# Patient Record
Sex: Female | Born: 1945 | Race: Black or African American | Hispanic: No | Smoking: Former smoker
Health system: Southern US, Community
[De-identification: ages and names within clinical notes are randomized; demographics above are authoritative.]

## PROBLEM LIST (undated history)

## (undated) DIAGNOSIS — E039 Hypothyroidism, unspecified: Secondary | ICD-10-CM

## (undated) HISTORY — DX: Hypothyroidism, unspecified: E03.9

## (undated) HISTORY — PX: ABDOMINAL HYSTERECTOMY: SHX81

---

## 2018-06-23 ENCOUNTER — Encounter: Payer: Self-pay | Admitting: Family Medicine

## 2018-07-19 ENCOUNTER — Encounter: Payer: Self-pay | Admitting: Gastroenterology

## 2018-07-19 ENCOUNTER — Encounter

## 2018-07-19 ENCOUNTER — Ambulatory Visit (INDEPENDENT_AMBULATORY_CARE_PROVIDER_SITE_OTHER): Payer: Medicare Other | Admitting: Gastroenterology

## 2018-07-19 VITALS — BP 106/63 | HR 73 | Wt 192.0 lb

## 2018-07-19 DIAGNOSIS — K219 Gastro-esophageal reflux disease without esophagitis: Secondary | ICD-10-CM

## 2018-07-19 DIAGNOSIS — Z1211 Encounter for screening for malignant neoplasm of colon: Secondary | ICD-10-CM

## 2018-07-19 NOTE — Patient Instructions (Signed)
F/U 3 months Bed wedge  Gastroesophageal Reflux Disease, Adult Normally, food travels down the esophagus and stays in the stomach to be digested. If a person has gastroesophageal reflux disease (GERD), food and stomach acid move back up into the esophagus. When this happens, the esophagus becomes sore and swollen (inflamed). Over time, GERD can make small holes (ulcers) in the lining of the esophagus. Follow these instructions at home: Diet  Follow a diet as told by your doctor. You may need to avoid foods and drinks such as: ? Coffee and tea (with or without caffeine). ? Drinks that contain alcohol. ? Energy drinks and sports drinks. ? Carbonated drinks or sodas. ? Chocolate and cocoa. ? Peppermint and mint flavorings. ? Garlic and onions. ? Horseradish. ? Spicy and acidic foods, such as peppers, chili powder, curry powder, vinegar, hot sauces, and BBQ sauce. ? Citrus fruit juices and citrus fruits, such as oranges, lemons, and limes. ? Tomato-based foods, such as red sauce, chili, salsa, and pizza with red sauce. ? Fried and fatty foods, such as donuts, french fries, potato chips, and high-fat dressings. ? High-fat meats, such as hot dogs, rib eye steak, sausage, ham, and bacon. ? High-fat dairy items, such as whole milk, butter, and cream cheese.  Eat small meals often. Avoid eating large meals.  Avoid drinking large amounts of liquid with your meals.  Avoid eating meals during the 2-3 hours before bedtime.  Avoid lying down right after you eat.  Do not exercise right after you eat. General instructions  Pay attention to any changes in your symptoms.  Take over-the-counter and prescription medicines only as told by your doctor. Do not take aspirin, ibuprofen, or other NSAIDs unless your doctor says it is okay.  Do not use any tobacco products, including cigarettes, chewing tobacco, and e-cigarettes. If you need help quitting, ask your doctor.  Wear loose clothes. Do not  wear anything tight around your waist.  Raise (elevate) the head of your bed about 6 inches (15 cm).  Try to lower your stress. If you need help doing this, ask your doctor.  If you are overweight, lose an amount of weight that is healthy for you. Ask your doctor about a safe weight loss goal.  Keep all follow-up visits as told by your doctor. This is important. Contact a doctor if:  You have new symptoms.  You lose weight and you do not know why it is happening.  You have trouble swallowing, or it hurts to swallow.  You have wheezing or a cough that keeps happening.  Your symptoms do not get better with treatment.  You have a hoarse voice. Get help right away if:  You have pain in your arms, neck, jaw, teeth, or back.  You feel sweaty, dizzy, or light-headed.  You have chest pain or shortness of breath.  You throw up (vomit) and your throw up looks like blood or coffee grounds.  You pass out (faint).  Your poop (stool) is bloody or black.  You cannot swallow, drink, or eat. This information is not intended to replace advice given to you by your health care provider. Make sure you discuss any questions you have with your health care provider. Document Released: 06/01/2008 Document Revised: 05/21/2016 Document Reviewed: 04/10/2015 Elsevier Interactive Patient Education  [employer].

## 2018-07-20 ENCOUNTER — Telehealth: Payer: Self-pay | Admitting: Gastroenterology

## 2018-07-20 MED ORDER — BISACODYL 5 MG PO TBEC: 10.0000 mg | DELAYED_RELEASE_TABLET | Freq: Once | ORAL | 0 refills | Status: AC

## 2018-07-20 MED ORDER — PEG 3350-KCL-NA BICARB-NACL 420 G PO SOLR: 4000.0000 mL | Freq: Once | ORAL | 0 refills | Status: AC

## 2018-07-20 NOTE — Telephone Encounter (Signed)
PT  Left vm she states her rx is  Dexalant 60 mg tablets and the other one rx Ramaton 300 mg  My number is [phone]

## 2018-07-21 NOTE — Progress Notes (Signed)
Betty Reynolds [address]  [place]  [place], [place] [address]  Main: [phone]  Fax: [phone]   Gastroenterology Consultation  Referring Provider:     Remi Haggard, FNP Primary Care Physician:  Remi Haggard, FNP Primary Gastroenterologist:  Dr. Vonda Reynolds Reason for Consultation:     Heartburn        HPI:    Chief Complaint  Patient presents with  . Gastroesophageal Reflux    and discuss colonoscopy    Betty Reynolds is a 72 y.o. y/o female referred for consultation & management  by Dr. Lavena Bullion, Jordan Likes, FNP.  Patient reported a 66-month history of daily, severe heartburn, which manifests as burning sensation in her chest.  No weight loss or abdominal pain.  Was prescribed Dexilant by her primary care provider, which was $120 for her, and she states despite it being that expensive, she went ahead and filled it because she was in a lot of pain from the heartburn.  Medication has helped but she is still having daily symptoms.  She is also on ranitidine at bedtime.  Past Medical History:  Diagnosis Date  . Hypothyroid     Past Surgical History:  Procedure Laterality Date  . ABDOMINAL HYSTERECTOMY      Prior to Admission medications   Medication Sig Start Date End Date Taking? Authorizing Provider  diphenhydrAMINE (BENADRYL) 25 MG tablet Take 25 mg by mouth every 6 (six) hours as needed.   Yes [provider]  levothyroxine (SYNTHROID, LEVOTHROID) 150 MCG tablet  08/16/17  Yes [provider]  Multiple Vitamin (MULTIVITAMIN) tablet Take 1 tablet by mouth daily.   Yes [provider]  potassium chloride (K-DUR) 10 MEQ tablet Take by mouth daily.   Yes [provider]    No family history on file.   Social History   Tobacco Use  . Smoking status: Former Smoker    Types: Cigarettes    Last attempt to quit: 12/30/2015    Years since quitting: 2.5  . Smokeless tobacco: Never Used  Substance Use  Topics  . Alcohol use: Never    Frequency: Never  . Drug use: Not on file    Allergies as of 07/19/2018  . (Not on File)    Review of Systems:    All systems reviewed and negative except where noted in HPI.   Physical Exam:  BP 106/63   Pulse 73   Wt 192 lb (87.1 kg)  No LMP recorded. Psych:  Alert and cooperative. Normal mood and affect. General:   Alert,  Well-developed, well-nourished, pleasant and cooperative in NAD Head:  Normocephalic and atraumatic. Eyes:  Sclera clear, no icterus.   Conjunctiva pink. Ears:  Normal auditory acuity. Nose:  No deformity, discharge, or lesions. Mouth:  No deformity or lesions,oropharynx pink & moist. Neck:  Supple; no masses or thyromegaly. Lungs:  Respirations even and unlabored.  Clear throughout to auscultation.   No wheezes, crackles, or rhonchi. No acute distress. Heart:  Regular rate and rhythm; no murmurs, clicks, rubs, or gallops. Abdomen:  Normal bowel sounds.  No bruits.  Soft, non-tender and non-distended without masses, hepatosplenomegaly or hernias noted.  No guarding or rebound tenderness.    Msk:  Symmetrical without gross deformities. Good, equal movement & strength bilaterally. Pulses:  Normal pulses noted. Extremities:  No clubbing or edema.  No cyanosis. Neurologic:  Alert and oriented x3;  grossly normal neurologically. Skin:  Intact without significant lesions or rashes.  No jaundice. Lymph Nodes:  No significant cervical adenopathy. Psych:  Alert and cooperative. Normal mood and affect.   Labs: Scanned labs reviewed, and show normal hemoglobin, MCV, and liver enzymes Imaging Studies: No results found.  Assessment and Plan:   Betty Reynolds is a 72 y.o. y/o female has been referred for severe heartburn despite PPI  Dexilant is too expensive for her, but she already has a 30-day supply We have offered to either switch her medication at omeprazole, or let her take the [place] and when she is running out, then  fill omeprazole.  She states she will think about it and let us know Patient educated extensively on acid reflux lifestyle modification, including buying a bed wedge, not eating 3 hrs before bedtime, diet modifications, and handout given for the same.   Due to uncontrolled symptoms, will proceed with EGD  She is also due for colorectal cancer screening and is agreeable to having it done along with her EGD  I have discussed alternative options, risks & benefits,  which include, but are not limited to, bleeding, infection, perforation,respiratory complication & drug reaction.  The patient agrees with this plan & written consent will be obtained.      Dr Betty Reynolds

## 2018-07-21 NOTE — Telephone Encounter (Signed)
LMTCO.

## 2018-08-01 NOTE — Telephone Encounter (Signed)
Pt called regarding prep instructions for colonoscopy. Questions answered. Also I asked her about the dexilant and zantac. She has not gotten them yet. Was given message from Dr. Bonna Gains regarding possibly taking the omeprazole. Pt decided she would wait until after her procedures.

## 2018-08-03 ENCOUNTER — Ambulatory Visit: Payer: Medicare Other | Admitting: Registered Nurse

## 2018-08-03 ENCOUNTER — Encounter: Payer: Self-pay | Admitting: *Deleted

## 2018-08-03 ENCOUNTER — Ambulatory Visit
Admission: RE | Admit: 2018-08-03 | Discharge: 2018-08-03 | Disposition: A | Discharge status: Home / Self Care | Payer: Medicare Other | Source: Ambulatory Visit | Attending: Gastroenterology | Admitting: Gastroenterology

## 2018-08-03 ENCOUNTER — Encounter
Admission: RE | Disposition: A | Discharge status: Home / Self Care | Payer: Self-pay | Source: Ambulatory Visit | Attending: Gastroenterology

## 2018-08-03 DIAGNOSIS — K2289 Other specified disease of esophagus: Secondary | ICD-10-CM

## 2018-08-03 DIAGNOSIS — K219 Gastro-esophageal reflux disease without esophagitis: Secondary | ICD-10-CM

## 2018-08-03 DIAGNOSIS — K5730 Diverticulosis of large intestine without perforation or abscess without bleeding: Secondary | ICD-10-CM | POA: Diagnosis not present

## 2018-08-03 DIAGNOSIS — R12 Heartburn: Secondary | ICD-10-CM

## 2018-08-03 DIAGNOSIS — K228 Other specified diseases of esophagus: Secondary | ICD-10-CM | POA: Diagnosis not present

## 2018-08-03 DIAGNOSIS — K529 Noninfective gastroenteritis and colitis, unspecified: Secondary | ICD-10-CM | POA: Insufficient documentation

## 2018-08-03 DIAGNOSIS — K319 Disease of stomach and duodenum, unspecified: Secondary | ICD-10-CM | POA: Diagnosis not present

## 2018-08-03 DIAGNOSIS — K2100 Gastro-esophageal reflux disease with esophagitis, without bleeding: Secondary | ICD-10-CM

## 2018-08-03 DIAGNOSIS — K2950 Unspecified chronic gastritis without bleeding: Secondary | ICD-10-CM | POA: Diagnosis not present

## 2018-08-03 DIAGNOSIS — D125 Benign neoplasm of sigmoid colon: Secondary | ICD-10-CM | POA: Insufficient documentation

## 2018-08-03 DIAGNOSIS — Z79899 Other long term (current) drug therapy: Secondary | ICD-10-CM | POA: Insufficient documentation

## 2018-08-03 DIAGNOSIS — K635 Polyp of colon: Secondary | ICD-10-CM

## 2018-08-03 DIAGNOSIS — E039 Hypothyroidism, unspecified: Secondary | ICD-10-CM | POA: Diagnosis not present

## 2018-08-03 DIAGNOSIS — K3189 Other diseases of stomach and duodenum: Secondary | ICD-10-CM

## 2018-08-03 DIAGNOSIS — Z1211 Encounter for screening for malignant neoplasm of colon: Secondary | ICD-10-CM | POA: Diagnosis present

## 2018-08-03 DIAGNOSIS — Z87891 Personal history of nicotine dependence: Secondary | ICD-10-CM | POA: Insufficient documentation

## 2018-08-03 HISTORY — PX: ESOPHAGOGASTRODUODENOSCOPY (EGD) WITH PROPOFOL: SHX5813

## 2018-08-03 HISTORY — PX: COLONOSCOPY WITH PROPOFOL: SHX5780

## 2018-08-03 SURGERY — COLONOSCOPY WITH PROPOFOL
Anesthesia: General

## 2018-08-03 MED ORDER — SODIUM CHLORIDE 0.9 % IJ SOLN
INTRAMUSCULAR | Status: DC | PRN
  Administered 2018-08-03: 3 mL

## 2018-08-03 MED ORDER — OMEPRAZOLE 20 MG PO CPDR: 20.0000 mg | DELAYED_RELEASE_CAPSULE | Freq: Two times a day (BID) | ORAL | 1 refills | Status: DC

## 2018-08-03 MED ORDER — LIDOCAINE HCL (CARDIAC) PF 100 MG/5ML IV SOSY
PREFILLED_SYRINGE | INTRAVENOUS | Status: DC | PRN
  Administered 2018-08-03: 60 mg via INTRAVENOUS

## 2018-08-03 MED ORDER — PROPOFOL 500 MG/50ML IV EMUL
INTRAVENOUS | Status: DC | PRN
  Administered 2018-08-03: 140 ug/kg/min via INTRAVENOUS

## 2018-08-03 MED ORDER — PROPOFOL 500 MG/50ML IV EMUL
INTRAVENOUS | Status: AC
  Filled 2018-08-03: qty 100

## 2018-08-03 MED ORDER — GLYCOPYRROLATE 0.2 MG/ML IJ SOLN
INTRAMUSCULAR | Status: DC | PRN
  Administered 2018-08-03: 0.2 mg via INTRAVENOUS

## 2018-08-03 MED ORDER — PROPOFOL 10 MG/ML IV BOLUS
INTRAVENOUS | Status: DC | PRN
  Administered 2018-08-03: 60 mg via INTRAVENOUS

## 2018-08-03 MED ORDER — SODIUM CHLORIDE 0.9 % IV SOLN
INTRAVENOUS | Status: DC
  Administered 2018-08-03: 1000 mL via INTRAVENOUS

## 2018-08-03 NOTE — Op Note (Signed)
[facility] Gastroenterology Patient Name: Betty Reynolds Procedure Date: 08/03/2018 9:10 AM MRN: [id number] Account #: [id number] Date of Birth: [birth year] Admit Type: Outpatient Age: 72 Room: [facility] ENDO ROOM 2 Gender: Female Note Status: Finalized Procedure:            Colonoscopy Indications:          Screening for colorectal malignant neoplasm Providers:            Harel Repetto B. Bonna Gains MD, MD Referring MD:         Jordan Likes. Lavena Bullion (Referring MD) Medicines:            Monitored Anesthesia Care Complications:        No immediate complications. Procedure:            Pre-Anesthesia Assessment:                       - ASA Grade Assessment: II - A patient with mild                        systemic disease.                       - Prior to the procedure, a History and Physical was                        performed, and patient medications, allergies and                        sensitivities were reviewed. The patient's tolerance of                        previous anesthesia was reviewed.                       - The risks and benefits of the procedure and the                        sedation options and risks were discussed with the                        patient. All questions were answered and informed                        consent was obtained.                       - Patient identification and proposed procedure were                        verified prior to the procedure by the physician, the                        nurse, the anesthesiologist, the anesthetist and the                        technician. The procedure was verified in the procedure                        room.  After obtaining informed consent, the colonoscope was                        passed under direct vision. Throughout the procedure,                        the patient's blood pressure, pulse, and oxygen                        saturations were monitored continuously. The                 Colonoscope was introduced through the anus and                        advanced to the the cecum, identified by appendiceal                        orifice and ileocecal valve. The colonoscopy was                        performed with ease. The patient tolerated the                        procedure well. The quality of the bowel preparation                        was good. Findings:      The perianal and digital rectal examinations were normal.      A 6 mm polyp was found in the sigmoid colon. The polyp was sessile. Area       was successfully injected with saline for lesion assessment, and this       injection appeared to lift the lesion adequately. It was injected with       saline to ensure it was a polyp and not an inverted diverticulum.       Appearance after lift consistent with a polyp. The polyp was removed       with a cold snare. Resection and retrieval were complete.      A 7 mm polyp was found in the sigmoid colon. The polyp was       semi-pedunculated. The polyp was removed with a hot snare. Resection and       retrieval were complete.      Multiple diverticula were found in the sigmoid colon.      The exam was otherwise without abnormality.      The rectum, sigmoid colon, descending colon, transverse colon, ascending       colon and cecum appeared normal.      The retroflexed view of the distal rectum and anal verge was normal and       showed no anal or rectal abnormalities. Impression:           - One 6 mm polyp in the sigmoid colon, removed with a                        cold snare. Resected and retrieved. Injected.                       - One 7 mm polyp in the sigmoid colon, removed with a  hot snare. Resected and retrieved.                       - Diverticulosis in the sigmoid colon.                       - The examination was otherwise normal.                       - The rectum, sigmoid colon, descending colon,                         transverse colon, ascending colon and cecum are normal.                       - The distal rectum and anal verge are normal on                        retroflexion view. Recommendation:       - Discharge patient to home (with escort).                       - Advance diet as tolerated.                       - Continue present medications.                       - Await pathology results.                       - Repeat colonoscopy in 5 years for surveillance.                       - The findings and recommendations were discussed with                        the patient.                       - The findings and recommendations were discussed with                        the patient's family.                       - Return to primary care physician as previously                        scheduled.                       - High fiber diet. Procedure Code(s):    --- Professional ---                       [phone], Colonoscopy, flexible; with removal of tumor(s),                        polyp(s), or other lesion(s) by snare technique                       [id number], Colonoscopy, flexible; with directed submucosal  injection(s), any substance Diagnosis Code(s):    --- Professional ---                       Z12.11, Encounter for screening for malignant neoplasm                        of colon                       D12.5, Benign neoplasm of sigmoid colon                       K57.30, Diverticulosis of large intestine without                        perforation or abscess without bleeding CPT copyright 2017 American Medical Association. All rights reserved. The codes documented in this report are preliminary and upon coder review may  be revised to meet current compliance requirements.  Vonda Antigua, MD Margretta Sidle B. Bonna Gains MD, MD 08/03/2018 10:14:23 AM This report has been signed electronically. Number of Addenda: 0 Note Initiated On: 08/03/2018 9:10 AM Scope Withdrawal Time: 0 hours  30 minutes 10 seconds  Total Procedure Duration: 0 hours 38 minutes 15 seconds  Estimated Blood Loss: Estimated blood loss: none.      [facility]

## 2018-08-03 NOTE — Transfer of Care (Signed)
Immediate Anesthesia Transfer of Care Note  Patient: Betty Reynolds  Procedure(s) Performed: COLONOSCOPY WITH PROPOFOL (N/A ) ESOPHAGOGASTRODUODENOSCOPY (EGD) WITH PROPOFOL (N/A )  Patient Location: PACU  Anesthesia Type:General  Level of Consciousness: sedated  Airway & Oxygen Therapy: Patient Spontanous Breathing and Patient connected to nasal cannula oxygen  Post-op Assessment: Report given to RN and Post -op Vital signs reviewed and stable  Post vital signs: Reviewed and stable  Last Vitals:  Vitals Value Taken Time  BP 120/79 08/03/2018 10:15 AM  Temp 36.1 C 08/03/2018 10:15 AM  Pulse 87 08/03/2018 10:15 AM  Resp 18 08/03/2018 10:15 AM  SpO2 97 % 08/03/2018 10:15 AM  Vitals shown include unvalidated device data.  Last Pain:  Vitals:   08/03/18 1010  TempSrc: Tympanic  PainSc:          Complications: No apparent anesthesia complications

## 2018-08-03 NOTE — H&P (Signed)
Vonda Antigua, MD [address] [place], [place], [place], [address] [address], [place], [place], [place], [address] Phone: [phone]  Fax: [phone]  Primary Care Physician:  Remi Haggard, FNP   Pre-Procedure History & Physical: HPI:  Betty Reynolds is a 72 y.o. female is here for a colonoscopy and EGD.   Past Medical History:  Diagnosis Date  . Hypothyroid     Past Surgical History:  Procedure Laterality Date  . ABDOMINAL HYSTERECTOMY      Prior to Admission medications   Medication Sig Start Date End Date Taking? Authorizing Provider  diphenhydrAMINE (BENADRYL) 25 MG tablet Take 25 mg by mouth every 6 (six) hours as needed.    [provider]  levothyroxine (SYNTHROID, LEVOTHROID) 150 MCG tablet  08/16/17   [provider]  Multiple Vitamin (MULTIVITAMIN) tablet Take 1 tablet by mouth daily.    [provider]  potassium chloride (K-DUR) 10 MEQ tablet Take by mouth daily.    [provider]    Allergies as of 07/21/2018  . (No Known Allergies)    History reviewed. No pertinent family history.  Social History   Socioeconomic History  . Marital status: Widowed    Spouse name: Not on file  . Number of children: Not on file  . Years of education: Not on file  . Highest education level: Not on file  Occupational History  . Not on file  Social Needs  . Financial resource strain: Not on file  . Food insecurity:    Worry: Not on file    Inability: Not on file  . Transportation needs:    Medical: Not on file    Non-medical: Not on file  Tobacco Use  . Smoking status: Former Smoker    Types: Cigarettes    Last attempt to quit: 12/30/2015    Years since quitting: 2.5  . Smokeless tobacco: Never Used  Substance and Sexual Activity  . Alcohol use: Never    Frequency: Never  . Drug use: Not on file  . Sexual activity: Not on file  Lifestyle  . Physical activity:    Days per week: Not on file    Minutes  per session: Not on file  . Stress: Not on file  Relationships  . Social connections:    Talks on phone: Not on file    Gets together: Not on file    Attends religious service: Not on file    Active member of club or organization: Not on file    Attends meetings of clubs or organizations: Not on file    Relationship status: Not on file  . Intimate partner violence:    Fear of current or ex partner: Not on file    Emotionally abused: Not on file    Physically abused: Not on file    Forced sexual activity: Not on file  Other Topics Concern  . Not on file  Social History Narrative  . Not on file    Review of Systems: See HPI, otherwise negative ROS  Physical Exam: BP 119/79   Pulse 76   Temp (!) 97.1 F (36.2 C) (Tympanic)   Resp 16   Ht 5\' 9"  (1.753 m)   Wt 190 lb (86.2 kg)   SpO2 100%   BMI 28.06 kg/m  General:   Alert,  pleasant and cooperative in NAD Head:  Normocephalic and atraumatic. Neck:  Supple; no masses or thyromegaly. Lungs:  Clear throughout to auscultation, normal respiratory  effort.    Heart:  +S1, +S2, Regular rate and rhythm, No edema. Abdomen:  Soft, nontender and nondistended. Normal bowel sounds, without guarding, and without rebound.   Neurologic:  Alert and  oriented x4;  grossly normal neurologically.  Impression/Plan: Betty Reynolds is here for a colonoscopy to be performed for average risk screening and EGD for Acid Reflux.  Risks, benefits, limitations, and alternatives regarding the procedures have been reviewed with the patient.  Questions have been answered.  All parties agreeable.   Virgel Manifold, MD  08/03/2018, 8:58 AM

## 2018-08-03 NOTE — Anesthesia Preprocedure Evaluation (Signed)
Anesthesia Evaluation  Patient identified by MRN, date of birth, ID band Patient awake    Reviewed: Allergy & Precautions, H&P , NPO status , reviewed documented beta blocker date and time   Airway Mallampati: II  TM Distance: >3 FB Neck ROM: full    Dental  (+) Upper Dentures, Partial Lower   Pulmonary former smoker,    Pulmonary exam normal        Cardiovascular Normal cardiovascular exam     Neuro/Psych    GI/Hepatic   Endo/Other  Hypothyroidism   Renal/GU      Musculoskeletal   Abdominal   Peds  Hematology   Anesthesia Other Findings Past Medical History: No date: Hypothyroid  Past Surgical History: No date: ABDOMINAL HYSTERECTOMY  BMI    Body Mass Index:  28.06 kg/m      Reproductive/Obstetrics                             Anesthesia Physical Anesthesia Plan  ASA: II  Anesthesia Plan: General   Post-op Pain Management:    Induction: Intravenous  PONV Risk Score and Plan: 2 and Treatment may vary due to age or medical condition and TIVA  Airway Management Planned: Nasal Cannula and Natural Airway  Additional Equipment:   Intra-op Plan:   Post-operative Plan:   Informed Consent: I have reviewed the patients History and Physical, chart, labs and discussed the procedure including the risks, benefits and alternatives for the proposed anesthesia with the patient or authorized representative who has indicated his/her understanding and acceptance.   Dental Advisory Given  Plan Discussed with: CRNA  Anesthesia Plan Comments:         Anesthesia Quick Evaluation

## 2018-08-03 NOTE — Anesthesia Post-op Follow-up Note (Signed)
Anesthesia QCDR form completed.        

## 2018-08-03 NOTE — Op Note (Signed)
[facility] Gastroenterology Patient Name: Betty Reynolds Procedure Date: 08/03/2018 9:12 AM MRN: [id number] Account #: [id number] Date of Birth: 1946/01/21 Admit Type: Outpatient Age: 72 Room: [facility] ENDO ROOM 2 Gender: Female Note Status: Finalized Procedure:            Upper GI endoscopy Indications:          Heartburn Providers:            Caisley Baxendale B. Bonna Gains MD, MD Referring MD:         Jordan Likes. Lavena Bullion (Referring MD) Medicines:            Monitored Anesthesia Care Complications:        No immediate complications. Procedure:            Pre-Anesthesia Assessment:                       - Prior to the procedure, a History and Physical was                        performed, and patient medications, allergies and                        sensitivities were reviewed. The patient's tolerance of                        previous anesthesia was reviewed.                       - The risks and benefits of the procedure and the                        sedation options and risks were discussed with the                        patient. All questions were answered and informed                        consent was obtained.                       - Patient identification and proposed procedure were                        verified prior to the procedure by the physician, the                        nurse, the anesthesiologist, the anesthetist and the                        technician. The procedure was verified in the procedure                        room.                       - ASA Grade Assessment: II - A patient with mild                        systemic disease.                       After obtaining  informed consent, the endoscope was                        passed under direct vision. Throughout the procedure,                        the patient's blood pressure, pulse, and oxygen                        saturations were monitored continuously. The Endoscope                        was  introduced through the mouth, and advanced to the                        second part of duodenum. The upper GI endoscopy was                        accomplished with ease. The patient tolerated the                        procedure well. Findings:      LA Grade B (one or more mucosal breaks greater than 5 mm, not extending       between the tops of two mucosal folds) esophagitis with no bleeding was       found in the distal esophagus.      One tongue of salmon-colored mucosa was present from 38 to 39 cm. No       other visible abnormalities were present. Biopsies were not done due to       the presence of esophagitis in the area, and biopsies in the setting of       esophagitis can overdiagnose/over-call dysplasia. Repeat EGD is       recommended instead to allow for healing of esophagitis and biopsies can       be done at that time.      A single localized, 3 mm non-bleeding erosion was found in the gastric       antrum. There were no stigmata of recent bleeding. Biopsies were taken       with a cold forceps for histology.      Patchy mildly erythematous mucosa without bleeding was found in the       gastric antrum. Biopsies were taken with a cold forceps for histology.       Biopsies were obtained in the gastric body, at the incisura and in the       gastric antrum with cold forceps for histology.      The duodenal bulb, second portion of the duodenum and examined duodenum       were normal. Impression:           - LA Grade B reflux esophagitis.                       - Salmon-colored mucosa.                       - Non-bleeding erosive gastropathy. Biopsied.                       - Erythematous mucosa in the antrum. Biopsied.                       -  Normal duodenal bulb, second portion of the duodenum                        and examined duodenum.                       - Biopsies were obtained in the gastric body, at the                        incisura and in the gastric  antrum. Recommendation:       - Await pathology results.                       - Discharge patient to home (with escort).                       - Advance diet as tolerated.                       - Continue present medications.                       - Patient has a contact number available for                        emergencies. The signs and symptoms of potential                        delayed complications were discussed with the patient.                        Return to normal activities tomorrow. Written discharge                        instructions were provided to the patient.                       - Discharge patient to home (with escort).                       - The findings and recommendations were discussed with                        the patient.                       - The findings and recommendations were discussed with                        the patient's family.                       - Follow an antireflux regimen.                       - Take prescribed proton pump inhibitor or H2 blocker                        (antacid) medications 30 - 60 minutes before meals (Pt                        was not taking any PPI since her last clinic  visit at                        her own discretion).                       - Repeat upper endoscopy 3-6 months. Procedure Code(s):    --- Professional ---                       [phone], Esophagogastroduodenoscopy, flexible, transoral;                        with biopsy, single or multiple Diagnosis Code(s):    --- Professional ---                       K21.0, Gastro-esophageal reflux disease with esophagitis                       K22.8, Other specified diseases of esophagus                       K31.89, Other diseases of stomach and duodenum                       R12, Heartburn CPT copyright 2017 American Medical Association. All rights reserved. The codes documented in this report are preliminary and upon coder review may  be revised to meet  current compliance requirements.  Vonda Antigua, MD Margretta Sidle B. Bonna Gains MD, MD 08/03/2018 9:30:07 AM This report has been signed electronically. Number of Addenda: 0 Note Initiated On: 08/03/2018 9:12 AM Estimated Blood Loss: Estimated blood loss: none.      [facility]

## 2018-08-03 NOTE — Anesthesia Postprocedure Evaluation (Signed)
Anesthesia Post Note  Patient: [name]  Procedure(s) Performed: COLONOSCOPY WITH PROPOFOL (N/A ) ESOPHAGOGASTRODUODENOSCOPY (EGD) WITH PROPOFOL (N/A )  Patient location during evaluation: Endoscopy Anesthesia Type: General Level of consciousness: awake and alert Pain management: pain level controlled Vital Signs Assessment: post-procedure vital signs reviewed and stable Respiratory status: spontaneous breathing, nonlabored ventilation and respiratory function stable Cardiovascular status: blood pressure returned to baseline and stable Postop Assessment: no apparent nausea or vomiting Anesthetic complications: no     Last Vitals:  Vitals:   08/03/18 1020 08/03/18 1030  BP: (!) 98/45 (!) 121/95  Pulse: 87 90  Resp: 17 20  Temp:    SpO2: 98% 100%    Last Pain:  Vitals:   08/03/18 1010  TempSrc: Tympanic  PainSc:                  Alphonsus Sias

## 2018-08-04 LAB — SURGICAL PATHOLOGY

## 2018-08-08 ENCOUNTER — Other Ambulatory Visit: Payer: Self-pay | Admitting: Family Medicine

## 2018-08-08 ENCOUNTER — Ambulatory Visit
Admission: RE | Admit: 2018-08-08 | Discharge: 2018-08-08 | Disposition: A | Discharge status: Home / Self Care | Payer: Medicare Other | Source: Ambulatory Visit | Attending: Family Medicine | Admitting: Family Medicine

## 2018-08-08 DIAGNOSIS — R52 Pain, unspecified: Secondary | ICD-10-CM

## 2018-08-08 DIAGNOSIS — M50322 Other cervical disc degeneration at C5-C6 level: Secondary | ICD-10-CM | POA: Insufficient documentation

## 2018-08-08 DIAGNOSIS — M5136 Other intervertebral disc degeneration, lumbar region: Secondary | ICD-10-CM | POA: Insufficient documentation

## 2018-08-08 DIAGNOSIS — M542 Cervicalgia: Secondary | ICD-10-CM | POA: Diagnosis present

## 2018-08-08 DIAGNOSIS — M545 Low back pain: Secondary | ICD-10-CM | POA: Diagnosis present

## 2018-10-31 ENCOUNTER — Ambulatory Visit: Payer: Medicare Other | Admitting: Gastroenterology

## 2018-11-07 ENCOUNTER — Encounter: Payer: Self-pay | Admitting: Gastroenterology

## 2018-11-07 ENCOUNTER — Ambulatory Visit (INDEPENDENT_AMBULATORY_CARE_PROVIDER_SITE_OTHER): Payer: Medicare Other | Admitting: Gastroenterology

## 2018-11-07 VITALS — BP 126/70 | HR 79 | Wt 195.4 lb

## 2018-11-07 DIAGNOSIS — K208 Other esophagitis without bleeding: Secondary | ICD-10-CM

## 2018-11-07 MED ORDER — OMEPRAZOLE 20 MG PO CPDR: 20.0000 mg | DELAYED_RELEASE_CAPSULE | Freq: Every day | ORAL | 0 refills | Status: DC

## 2018-11-07 NOTE — Progress Notes (Signed)
Vonda Antigua, MD [address]  [address]  [place], [place] [address]  Main: [phone]  Fax: [phone]   Primary Care Physician: Remi Haggard, FNP  Primary Gastroenterologist:  Dr. Vonda Antigua  Chief complaint: Follow-up for esophagitis  HPI: Betty Reynolds is a 72 y.o. female seen in July 2019 due to severe heartburn while taking Dexilant.  Patient underwent EGD in August 2019 showing grade B esophagitis.  One tongue of salmon-colored mucosa was seen but was not biopsied due to presence of esophagitis.  Single gastric antrum erosion, and gastric erythema was seen.  Biopsies were negative for H. Pylori.  Patient was given omeprazole after the EGD and states she completed 3 weeks ago.  Reports 1-2 episodes a week of heartburn since then and has been taking Tums.  Screening colonoscopy at the same time showed 2 polyps, 6 mm and 7 mm in the sigmoid colon.  Diverticulosis reported.  One polyp showed tubular adenoma.  Another showed fragments of colitis with minimal architectural changes.  Patient denies any diarrhea, abdominal pain.  Current Outpatient Medications  Medication Sig Dispense Refill  . diphenhydrAMINE (BENADRYL) 25 MG tablet Take 25 mg by mouth every 6 (six) hours as needed.    Marland Kitchen levothyroxine (SYNTHROID, LEVOTHROID) 150 MCG tablet     . Multiple Vitamin (MULTIVITAMIN) tablet Take 1 tablet by mouth daily.    Marland Kitchen omeprazole (PRILOSEC) 20 MG capsule Take 1 capsule (20 mg total) by mouth 2 (two) times daily before a meal. 60 capsule 1  . potassium chloride (K-DUR) 10 MEQ tablet Take by mouth daily.     No current facility-administered medications for this visit.     Allergies as of 11/07/2018  . (No Known Allergies)    ROS:  General: Negative for anorexia, weight loss, fever, chills, fatigue, weakness. ENT: Negative for hoarseness, difficulty swallowing , nasal congestion. CV: Negative for chest pain, angina, palpitations, dyspnea on exertion,  peripheral edema.  Respiratory: Negative for dyspnea at rest, dyspnea on exertion, cough, sputum, wheezing.  GI: See history of present illness. GU:  Negative for dysuria, hematuria, urinary incontinence, urinary frequency, nocturnal urination.  Endo: Negative for unusual weight change.    Physical Examination: Vitals:   11/07/18 1049  BP: 126/70  Pulse: 79  Weight: 195 lb 6.4 oz (88.6 kg)     General: Well-nourished, well-developed in no acute distress.  Eyes: No icterus. Conjunctivae pink. Mouth: Oropharyngeal mucosa moist and pink , no lesions erythema or exudate. Neck: Supple, Trachea midline Abdomen: Bowel sounds are normal, nontender, nondistended, no hepatosplenomegaly or masses, no abdominal bruits or hernia , no rebound or guarding.   Extremities: No lower extremity edema. No clubbing or deformities. Neuro: Alert and oriented x 3.  Grossly intact. Skin: Warm and dry, no jaundice.   Psych: Alert and cooperative, normal mood and affect.   Labs: CMP  No results found for: NA, K, CL, CO2, GLUCOSE, BUN, CREATININE, CALCIUM, PROT, ALBUMIN, AST, ALT, ALKPHOS, BILITOT, GFRNONAA, GFRAA No results found for: WBC, HGB, HCT, MCV, PLT  Imaging Studies: No results found.  Assessment and Plan:   Betty Reynolds is a 72 y.o. y/o female here for follow-up of esophagitis and GERD  We discussed need for repeat EGD to reevaluate if esophagitis is resolved and obtain biopsies for Barrett's esophagus Since patient has been having heartburn 1-2 times a week as she stopped taking her omeprazole, she would like to resume omeprazole prior to the EGD. (Risks of PPI use  were discussed with patient including bone loss, C. Diff diarrhea, pneumonia, infections, CKD, electrolyte abnormalities.  If clinically possible based on symptoms, goal would be to maintain patient on the lowest dose possible, or discontinue the medication with institution of acid reflux lifestyle modifications over time. Pt.  Verbalizes understanding and chooses to continue the medication.)  We will refill omeprazole for once daily Patient educated extensively on acid reflux lifestyle modification, including buying a bed wedge, not eating 3 hrs before bedtime, diet modifications, and handout given for the same.   Repeat EGD in 2 to 4 weeks after restarting omeprazole  Colonoscopy up-to-date.  Surveillance due 5 years from August 2019    Dr Vonda Antigua

## 2018-11-07 NOTE — Patient Instructions (Signed)
Bed wedge  EGD scheduled for 11/30/2018  Gastroesophageal Reflux Disease, Adult Normally, food travels down the esophagus and stays in the stomach to be digested. If a person has gastroesophageal reflux disease (GERD), food and stomach acid move back up into the esophagus. When this happens, the esophagus becomes sore and swollen (inflamed). Over time, GERD can make small holes (ulcers) in the lining of the esophagus. Follow these instructions at home: Diet  Follow a diet as told by your doctor. You may need to avoid foods and drinks such as: ? Coffee and tea (with or without caffeine). ? Drinks that contain alcohol. ? Energy drinks and sports drinks. ? Carbonated drinks or sodas. ? Chocolate and cocoa. ? Peppermint and mint flavorings. ? Garlic and onions. ? Horseradish. ? Spicy and acidic foods, such as peppers, chili powder, curry powder, vinegar, hot sauces, and BBQ sauce. ? Citrus fruit juices and citrus fruits, such as oranges, lemons, and limes. ? Tomato-based foods, such as red sauce, chili, salsa, and pizza with red sauce. ? Fried and fatty foods, such as donuts, french fries, potato chips, and high-fat dressings. ? High-fat meats, such as hot dogs, rib eye steak, sausage, ham, and bacon. ? High-fat dairy items, such as whole milk, butter, and cream cheese.  Eat small meals often. Avoid eating large meals.  Avoid drinking large amounts of liquid with your meals.  Avoid eating meals during the 2-3 hours before bedtime.  Avoid lying down right after you eat.  Do not exercise right after you eat. General instructions  Pay attention to any changes in your symptoms.  Take over-the-counter and prescription medicines only as told by your doctor. Do not take aspirin, ibuprofen, or other NSAIDs unless your doctor says it is okay.  Do not use any tobacco products, including cigarettes, chewing tobacco, and e-cigarettes. If you need help quitting, ask your doctor.  Wear loose  clothes. Do not wear anything tight around your waist.  Raise (elevate) the head of your bed about 6 inches (15 cm).  Try to lower your stress. If you need help doing this, ask your doctor.  If you are overweight, lose an amount of weight that is healthy for you. Ask your doctor about a safe weight loss goal.  Keep all follow-up visits as told by your doctor. This is important. Contact a doctor if:  You have new symptoms.  You lose weight and you do not know why it is happening.  You have trouble swallowing, or it hurts to swallow.  You have wheezing or a cough that keeps happening.  Your symptoms do not get better with treatment.  You have a hoarse voice. Get help right away if:  You have pain in your arms, neck, jaw, teeth, or back.  You feel sweaty, dizzy, or light-headed.  You have chest pain or shortness of breath.  You throw up (vomit) and your throw up looks like blood or coffee grounds.  You pass out (faint).  Your poop (stool) is bloody or black.  You cannot swallow, drink, or eat. This information is not intended to replace advice given to you by your health care provider. Make sure you discuss any questions you have with your health care provider. Document Released: 06/01/2008 Document Revised: 05/21/2016 Document Reviewed: 04/10/2015 Elsevier Interactive Patient Education  [employer].

## 2018-11-28 ENCOUNTER — Telehealth: Payer: Self-pay | Admitting: Gastroenterology

## 2018-11-28 NOTE — Telephone Encounter (Signed)
Pt request instructions for her upcomming upper G.I procedure

## 2018-11-28 NOTE — Telephone Encounter (Signed)
Pt came by office to say she ate a biscuit this am. Pt given another copy of EGD prep instructions which is scheduled for 11/30/18.

## 2018-11-30 ENCOUNTER — Ambulatory Visit
Admission: RE | Admit: 2018-11-30 | Discharge: 2018-11-30 | Disposition: A | Discharge status: Home / Self Care | Payer: Medicare Other | Source: Ambulatory Visit | Attending: Gastroenterology | Admitting: Gastroenterology

## 2018-11-30 ENCOUNTER — Ambulatory Visit: Payer: Medicare Other | Admitting: Anesthesiology

## 2018-11-30 ENCOUNTER — Encounter
Admission: RE | Disposition: A | Discharge status: Home / Self Care | Payer: Self-pay | Source: Ambulatory Visit | Attending: Gastroenterology

## 2018-11-30 ENCOUNTER — Other Ambulatory Visit: Payer: Self-pay

## 2018-11-30 DIAGNOSIS — K210 Gastro-esophageal reflux disease with esophagitis: Secondary | ICD-10-CM | POA: Diagnosis not present

## 2018-11-30 DIAGNOSIS — Z87891 Personal history of nicotine dependence: Secondary | ICD-10-CM | POA: Diagnosis not present

## 2018-11-30 DIAGNOSIS — Z79899 Other long term (current) drug therapy: Secondary | ICD-10-CM | POA: Insufficient documentation

## 2018-11-30 DIAGNOSIS — K208 Other esophagitis without bleeding: Secondary | ICD-10-CM

## 2018-11-30 DIAGNOSIS — R12 Heartburn: Secondary | ICD-10-CM | POA: Diagnosis not present

## 2018-11-30 DIAGNOSIS — K228 Other specified diseases of esophagus: Secondary | ICD-10-CM | POA: Diagnosis not present

## 2018-11-30 DIAGNOSIS — K2289 Other specified disease of esophagus: Secondary | ICD-10-CM

## 2018-11-30 HISTORY — PX: ESOPHAGOGASTRODUODENOSCOPY (EGD) WITH PROPOFOL: SHX5813

## 2018-11-30 SURGERY — ESOPHAGOGASTRODUODENOSCOPY (EGD) WITH PROPOFOL
Anesthesia: General

## 2018-11-30 MED ORDER — LIDOCAINE HCL (PF) 2 % IJ SOLN
INTRAMUSCULAR | Status: AC
  Filled 2018-11-30: qty 10

## 2018-11-30 MED ORDER — SODIUM CHLORIDE 0.9 % IV SOLN
INTRAVENOUS | Status: DC
  Administered 2018-11-30: 09:00:00 via INTRAVENOUS

## 2018-11-30 MED ORDER — PROPOFOL 10 MG/ML IV BOLUS
INTRAVENOUS | Status: DC | PRN
  Administered 2018-11-30: 43 mg via INTRAVENOUS

## 2018-11-30 MED ORDER — PROPOFOL 500 MG/50ML IV EMUL
INTRAVENOUS | Status: DC | PRN
  Administered 2018-11-30: 140 ug/kg/min via INTRAVENOUS

## 2018-11-30 MED ORDER — PROPOFOL 500 MG/50ML IV EMUL
INTRAVENOUS | Status: AC
  Filled 2018-11-30: qty 50

## 2018-11-30 NOTE — Anesthesia Postprocedure Evaluation (Signed)
Anesthesia Post Note  Patient: [name]  Procedure(s) Performed: ESOPHAGOGASTRODUODENOSCOPY (EGD) WITH PROPOFOL (N/A )  Patient location during evaluation: Endoscopy Anesthesia Type: General Level of consciousness: awake and alert Pain management: pain level controlled Vital Signs Assessment: post-procedure vital signs reviewed and stable Respiratory status: spontaneous breathing and respiratory function stable Cardiovascular status: stable Anesthetic complications: no     Last Vitals:  Vitals:   11/30/18 0902 11/30/18 1007  BP: 130/74 133/72  Pulse: 80 95  Resp: 17 15  Temp: 36.6 C (!) 36.2 C  SpO2: 97% 99%    Last Pain:  Vitals:   11/30/18 1007  TempSrc: Tympanic  PainSc: 0-No pain                 Roselle Norton K

## 2018-11-30 NOTE — Anesthesia Post-op Follow-up Note (Signed)
Anesthesia QCDR form completed.        

## 2018-11-30 NOTE — Anesthesia Preprocedure Evaluation (Signed)
Anesthesia Evaluation  Patient identified by MRN, date of birth, ID band Patient awake    Reviewed: Allergy & Precautions, NPO status , Patient's Chart, lab work & pertinent test results  History of Anesthesia Complications Negative for: history of anesthetic complications  Airway Mallampati: II       Dental  (+) Upper Dentures, Partial Lower   Pulmonary neg sleep apnea, neg COPD, former smoker,           Cardiovascular (-) hypertension(-) Past MI and (-) CHF (-) dysrhythmias (-) Valvular Problems/Murmurs     Neuro/Psych neg Seizures    GI/Hepatic Neg liver ROS, GERD  Medicated and Controlled,  Endo/Other  neg diabetesHypothyroidism   Renal/GU negative Renal ROS     Musculoskeletal   Abdominal   Peds  Hematology   Anesthesia Other Findings   Reproductive/Obstetrics                            Anesthesia Physical Anesthesia Plan  ASA: II  Anesthesia Plan: General   Post-op Pain Management:    Induction: Intravenous  PONV Risk Score and Plan: 3 and TIVA, Propofol infusion and Treatment may vary due to age or medical condition  Airway Management Planned: Nasal Cannula  Additional Equipment:   Intra-op Plan:   Post-operative Plan:   Informed Consent: I have reviewed the patients History and Physical, chart, labs and discussed the procedure including the risks, benefits and alternatives for the proposed anesthesia with the patient or authorized representative who has indicated his/her understanding and acceptance.     Plan Discussed with:   Anesthesia Plan Comments:         Anesthesia Quick Evaluation

## 2018-11-30 NOTE — Transfer of Care (Signed)
Immediate Anesthesia Transfer of Care Note  Patient: Betty Reynolds  Procedure(s) Performed: ESOPHAGOGASTRODUODENOSCOPY (EGD) WITH PROPOFOL (N/A )  Patient Location: PACU and Endoscopy Unit  Anesthesia Type:General  Level of Consciousness: sedated  Airway & Oxygen Therapy: Patient Spontanous Breathing  Post-op Assessment: Report given to RN  Post vital signs: stable  Last Vitals:  Vitals Value Taken Time  BP 133/72 11/30/2018 10:07 AM  Temp 36.2 C 11/30/2018 10:07 AM  Pulse 81 11/30/2018 10:09 AM  Resp 13 11/30/2018 10:09 AM  SpO2 98 % 11/30/2018 10:09 AM  Vitals shown include unvalidated device data.  Last Pain:  Vitals:   11/30/18 1007  TempSrc: Tympanic  PainSc: 0-No pain         Complications: No apparent anesthesia complications

## 2018-12-01 ENCOUNTER — Encounter: Payer: Self-pay | Admitting: Gastroenterology

## 2018-12-01 LAB — SURGICAL PATHOLOGY

## 2018-12-01 NOTE — Op Note (Signed)
[facility] Gastroenterology Patient Name: Betty Reynolds Procedure Date: 11/30/2018 9:47 AM MRN: [id number] Account #: [id number] Date of Birth: [birth year] Admit Type: Outpatient Age: 72 Room: [facility] ENDO ROOM 4 Gender: Female Note Status: Finalized Procedure:            Upper GI endoscopy Indications:          Heartburn, Reflux esophagitis, Follow-up of reflux                        esophagitis, Exclusion of Barrett's esophagus Providers:            Varnita B. Bonna Gains MD, MD Referring MD:         Jordan Likes. Lavena Bullion (Referring MD) Medicines:            Monitored Anesthesia Care Complications:        No immediate complications. Procedure:            Pre-Anesthesia Assessment:                       - Prior to the procedure, a History and Physical was                        performed, and patient medications, allergies and                        sensitivities were reviewed. The patient's tolerance of                        previous anesthesia was reviewed.                       - The risks and benefits of the procedure and the                        sedation options and risks were discussed with the                        patient. All questions were answered and informed                        consent was obtained.                       - Patient identification and proposed procedure were                        verified prior to the procedure by the physician, the                        nurse, the anesthesiologist, the anesthetist and the                        technician. The procedure was verified in the procedure                        room.                       - ASA Grade Assessment: II - A patient with mild  systemic disease.                       After obtaining informed consent, the endoscope was                        passed under direct vision. Throughout the procedure,                        the patient's blood pressure, pulse, and  oxygen                        saturations were monitored continuously. The Endoscope                        was introduced through the mouth, and advanced to the                        second part of duodenum. The upper GI endoscopy was                        accomplished with ease. The patient tolerated the                        procedure well. Findings:      Irregular Z line with 1 small tongue. No other visible abnormalities       were present. The maximum longitudinal extent of these esophageal       mucosal changes was 0.5 cm in length. Biopsies were taken with a cold       forceps for histology.      The exam of the esophagus was otherwise normal.      The entire examined stomach was normal.      The duodenal bulb, second portion of the duodenum and examined duodenum       were normal. Impression:           - Salmon-colored mucosa suspicious for short-segment                        Barrett's esophagus. Biopsied.                       - Normal stomach.                       - Normal duodenal bulb, second portion of the duodenum                        and examined duodenum. Recommendation:       - Await pathology results.                       - Discharge patient to home (with escort).                       - Advance diet as tolerated.                       - Continue present medications.                       - Patient has a contact number available for  emergencies. The signs and symptoms of potential                        delayed complications were discussed with the patient.                        Return to normal activities tomorrow. Written discharge                        instructions were provided to the patient.                       - Discharge patient to home (with escort).                       - The findings and recommendations were discussed with                        the patient.                       - The findings and recommendations were  discussed with                        the patient's family. Procedure Code(s):    --- Professional ---                       [phone], Esophagogastroduodenoscopy, flexible, transoral;                        with biopsy, single or multiple Diagnosis Code(s):    --- Professional ---                       K22.8, Other specified diseases of esophagus                       R12, Heartburn                       K21.0, Gastro-esophageal reflux disease with esophagitis CPT copyright 2018 American Medical Association. All rights reserved. The codes documented in this report are preliminary and upon coder review may  be revised to meet current compliance requirements.  Vonda Antigua, MD Margretta Sidle B. Bonna Gains MD, MD 11/30/2018 10:09:12 AM This report has been signed electronically. Number of Addenda: 0 Note Initiated On: 11/30/2018 9:47 AM Estimated Blood Loss: Estimated blood loss: none.      [facility]

## 2018-12-07 ENCOUNTER — Telehealth: Payer: Self-pay

## 2018-12-07 NOTE — Telephone Encounter (Signed)
-----   Message from Virgel Manifold, MD sent at 12/06/2018 12:58 PM EST ----- Jackelyn Poling please let patient know, her esophageal biopsies were benign.

## 2018-12-07 NOTE — Telephone Encounter (Signed)
Pt aware of results 

## 2018-12-22 ENCOUNTER — Other Ambulatory Visit: Payer: Self-pay | Admitting: Gastroenterology

## 2019-03-29 IMAGING — CR DG CERVICAL SPINE 2 OR 3 VIEWS
1 series · 4 of 4 positions shown · non-contrast
Comparison: None.

CLINICAL DATA: Chronic neck pain.

EXAM:
CERVICAL SPINE - 2-3 VIEW

[Series 1: dg cervical spine 2 or 3 views · 0.14mm/px · 4 of 4 slices shown]
[im 1/4]
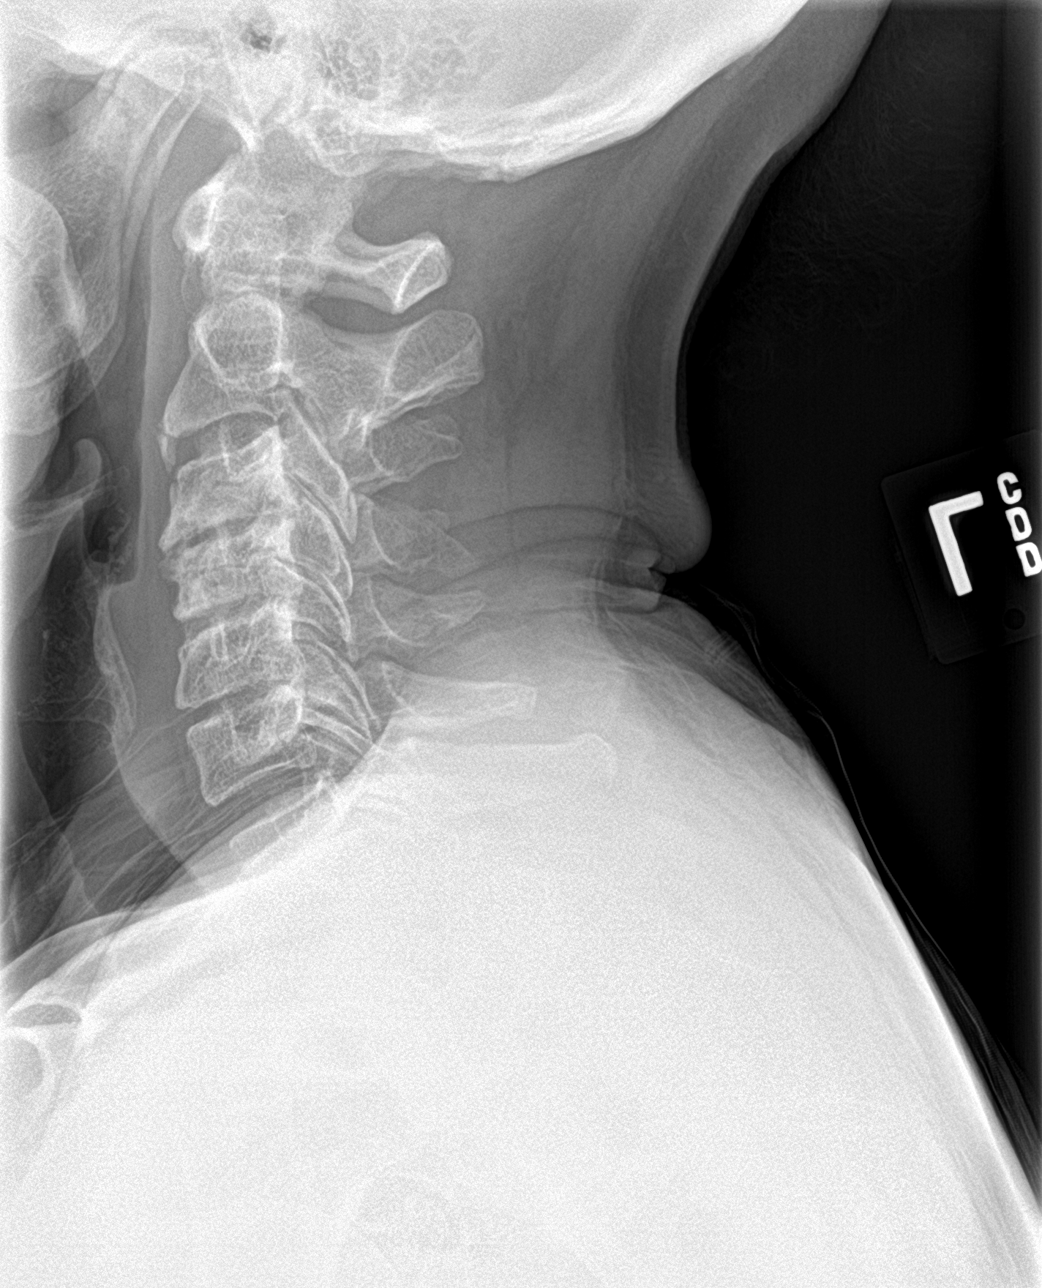
[im 2/4]
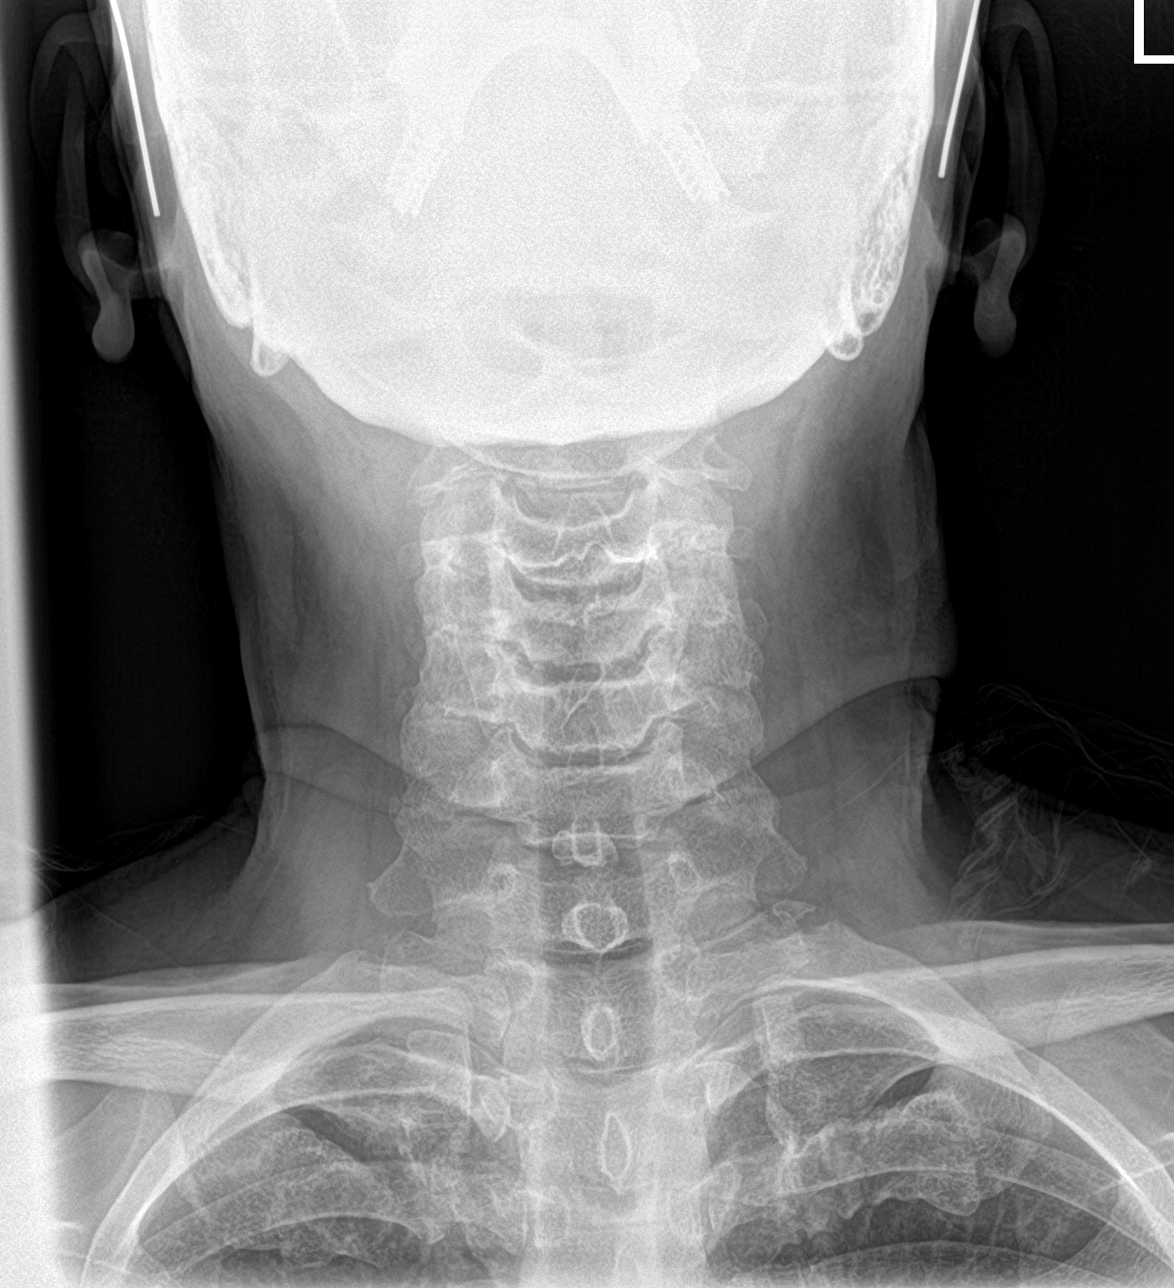
[im 3/4]
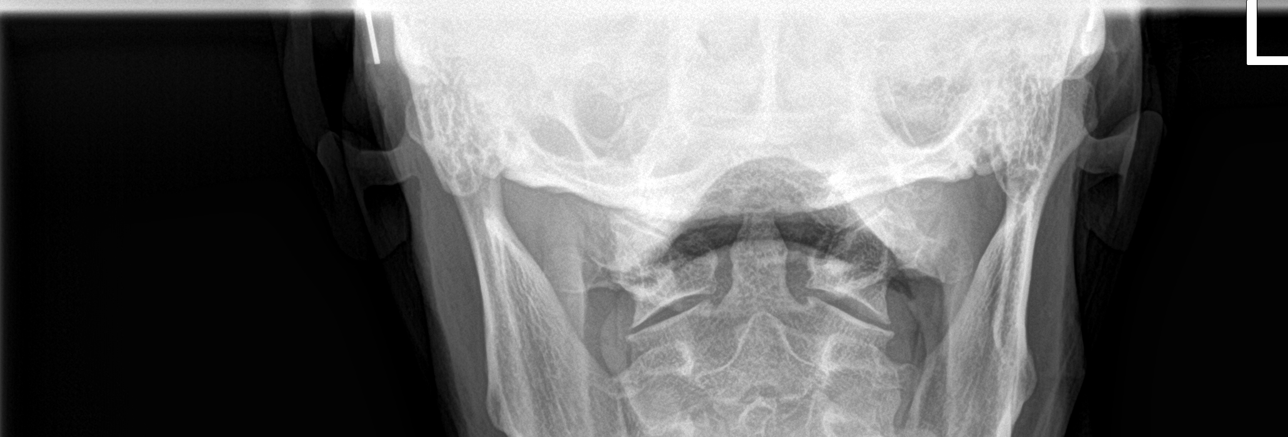
[im 4/4]
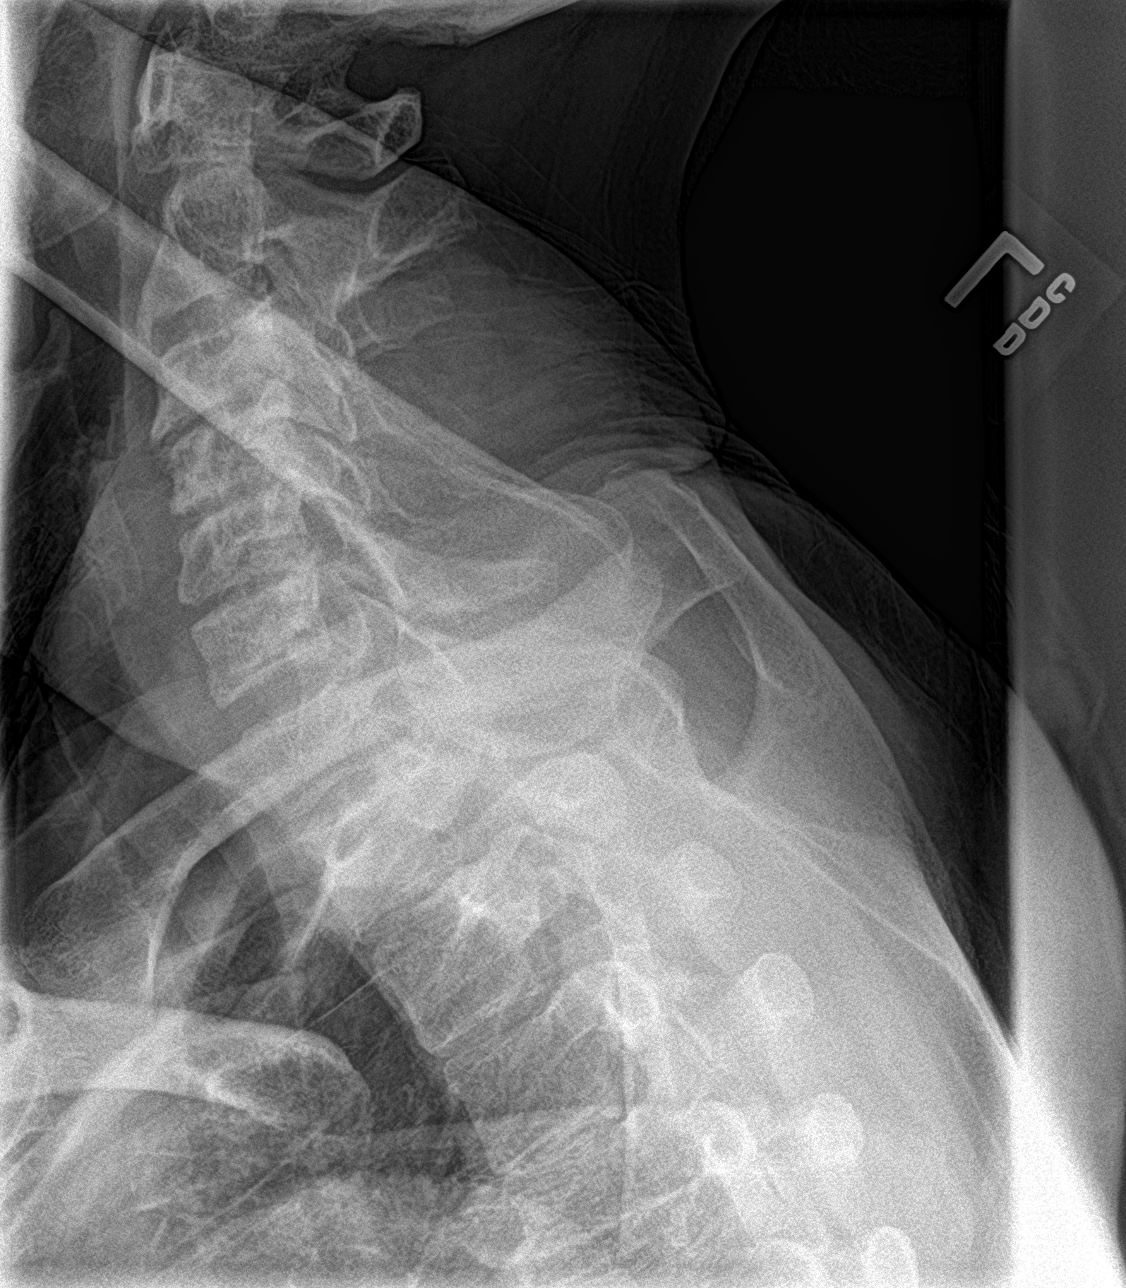

[4 of 4 positions shown; findings below may reference images not displayed]

FINDINGS: There is moderate disc space narrowing at C3-4, C4-5, and C5-6.
Cervical alignment is normal. Prevertebral soft tissues are normal.
No appreciable facet arthritis. No fracture or subluxation.
IMPRESSION: Chronic degenerative disc disease from C3-4 through C5-6.

## 2019-09-11 DIAGNOSIS — M5416 Radiculopathy, lumbar region: Secondary | ICD-10-CM | POA: Insufficient documentation

## 2019-09-11 DIAGNOSIS — M25569 Pain in unspecified knee: Secondary | ICD-10-CM | POA: Insufficient documentation

## 2020-02-05 ENCOUNTER — Ambulatory Visit: Payer: Medicare Other | Attending: Internal Medicine

## 2020-02-05 DIAGNOSIS — Z23 Encounter for immunization: Secondary | ICD-10-CM | POA: Insufficient documentation

## 2020-02-05 NOTE — Progress Notes (Signed)
   Covid-19 Vaccination Clinic  Name:  Betty Reynolds    MRN: [id number] DOB: Jul 06, 1946  02/05/2020  Betty Reynolds was observed post Covid-19 immunization for 15 minutes without incidence. She was provided with Vaccine Information Sheet and instruction to access the V-Safe system.   Betty Reynolds was instructed to call 911 with any severe reactions post vaccine: Marland Kitchen Difficulty breathing  . Swelling of your face and throat  . A fast heartbeat  . A bad rash all over your body  . Dizziness and weakness    Immunizations Administered    Name Date Dose VIS Date Route   Moderna COVID-19 Vaccine 02/05/2020  1:17 PM 0.5 mL 11/28/2019 Intramuscular   Manufacturer: Moderna   Lot: [id number]   Kenn[place]VO:[id number]

## 2020-03-06 ENCOUNTER — Ambulatory Visit: Payer: Medicare Other | Attending: Internal Medicine

## 2020-03-06 DIAGNOSIS — Z23 Encounter for immunization: Secondary | ICD-10-CM | POA: Insufficient documentation

## 2020-03-06 NOTE — Progress Notes (Signed)
   Covid-19 Vaccination Clinic  Name:  Betty Reynolds    MRN: [id number] DOB: [birth year]  03/06/2020  Betty Reynolds was observed post Covid-19 immunization for 15 minutes without incident. She was provided with Vaccine Information Sheet and instruction to access the V-Safe system.   Betty Reynolds was instructed to call 911 with any severe reactions post vaccine: Marland Kitchen Difficulty breathing  . Swelling of face and throat  . A fast heartbeat  . A bad rash all over body  . Dizziness and weakness   Immunizations Administered    Name Date Dose VIS Date Route   Moderna COVID-19 Vaccine 03/06/2020 12:53 PM 0.5 mL 11/28/2019 Intramuscular   Manufacturer: Moderna   Lot: [id number]   [id number]

## 2021-05-22 ENCOUNTER — Ambulatory Visit (INDEPENDENT_AMBULATORY_CARE_PROVIDER_SITE_OTHER): Payer: Medicare Other | Admitting: Dermatology

## 2021-05-22 ENCOUNTER — Other Ambulatory Visit: Payer: Self-pay

## 2021-05-22 DIAGNOSIS — L720 Epidermal cyst: Secondary | ICD-10-CM | POA: Diagnosis not present

## 2021-05-22 DIAGNOSIS — H0012 Chalazion right lower eyelid: Secondary | ICD-10-CM

## 2021-05-22 DIAGNOSIS — L820 Inflamed seborrheic keratosis: Secondary | ICD-10-CM

## 2021-05-22 DIAGNOSIS — L738 Other specified follicular disorders: Secondary | ICD-10-CM

## 2021-05-22 MED ORDER — DOXYCYCLINE HYCLATE 100 MG PO TABS: ORAL_TABLET | ORAL | 0 refills | Status: DC

## 2021-05-22 NOTE — Patient Instructions (Addendum)
If you have any questions or concerns for your doctor, please call our main line at [phone] and press option 4 to reach your doctor's medical assistant. If no one answers, please leave a voicemail as directed and we will return your call as soon as possible. Messages left after 4 pm will be answered the following business day.   You may also send [place] a message via [place]. We typically respond to MyChart messages within 1-2 business days.  For prescription refills, please ask your pharmacy to contact our office. Our fax number is [phone].  If you have an urgent issue when the clinic is closed that cannot wait until the next business day, you can page your doctor at the number below.    Please note that while we do our best to be available for urgent issues outside of office hours, we are not available 24/7.   If you have an urgent issue and are unable to reach [place], you may choose to seek medical care at your doctor's office, retail clinic, urgent care center, or emergency room.  If you have a medical emergency, please immediately call 911 or go to the emergency department.  Pager Numbers  - Dr. Nehemiah Massed: [phone]  - Dr. Laurence Ferrari: [phone]  - Dr. Nicole Kindred: [phone]  In the event of inclement weather, please call our main line at [phone] for an update on the status of any delays or closures.  Dermatology Medication Tips: Please keep the boxes that topical medications come in in order to help keep track of the instructions about where and how to use these. Pharmacies typically print the medication instructions only on the boxes and not directly on the medication tubes.   If your medication is too expensive, please contact our office at [phone] option 4 or send [place] a message through [place].   We are unable to tell what your co-pay for medications will be in advance as this is different depending on your insurance coverage. However, we may be able to find a substitute  medication at lower cost or fill out paperwork to get insurance to cover a needed medication.   If a prior authorization is required to get your medication covered by your insurance company, please allow [place] 1-2 business days to complete this process.  Drug prices often vary depending on where the prescription is filled and some pharmacies may offer cheaper prices.  The website www.goodrx.com contains coupons for medications through different pharmacies. The prices here do not account for what the cost may be with help from insurance (it may be cheaper with your insurance), but the website can give you the price if you did not use any insurance.  - You can print the associated coupon and take it with your prescription to the pharmacy.  - You may also stop by our office during regular business hours and pick up a GoodRx coupon card.  - If you need your prescription sent electronically to a different pharmacy, notify our office through [facility] or by phone at [phone] option 4.  Doxycycline should be taken with food to prevent nausea. Do not lay down for 30 minutes after taking. Be cautious with sun exposure and use good sun protection while on this medication. Pregnant women should not take this medication.

## 2021-05-22 NOTE — Progress Notes (Signed)
   New Patient Visit  Subjective  Betty Reynolds is a 75 y.o. female who presents for the following: Skin Problem (Check growth under the skin on her face ). Pt c/o spots on her chest irritated by her bra she would like removed today, Check a growth on her eyelid.    The following portions of the chart were reviewed this encounter and updated as appropriate:   Tobacco  Allergies  Meds  Problems  Med Hx  Surg Hx  Fam Hx     Review of Systems:  No other skin or systemic complaints except as noted in HPI or Assessment and Plan.  Objective  Well appearing patient in no apparent distress; mood and affect are within normal limits.  A focused examination was performed including face,neck,chest, breast . Relevant physical exam findings are noted in the Assessment and Plan.  Objective  face: Small yellow papules with a central dell.   Objective  right lateral base of neck: 0.6 cm Subcutaneous nodule.   Objective  Left Inframammary x 3, right inframammary x 2 (5): Erythematous keratotic or waxy stuck-on papule or plaque.   Objective  Right lower eyelid: Small flesh bump   Assessment & Plan  Sebaceous hyperplasia of face face Benign-appearing.  Observation.  Call clinic for new or changing lesions.  Recommend daily use of broad spectrum spf 30+ sunscreen to sun-exposed areas.  Observe   Epidermal inclusion cyst right lateral base of neck Benign-appearing. Okay to keep unless bothersome.  Discussed surgical option.   Observation.  Call clinic for new or changing moles.  Recommend daily use of broad spectrum spf 30+ sunscreen to sun-exposed areas.    Inflamed seborrheic keratosis (5) Left Inframammary x 3, right inframammary x 2 Discussed with pt if  treated with LN2 may cause discoloration   Destruction of lesion - Left Inframammary x 3, right inframammary x 2 Complexity: simple   Destruction method: cryotherapy   Informed consent: discussed and consent obtained    Timeout:  patient name, date of birth, surgical site, and procedure verified Lesion destroyed using liquid nitrogen: Yes   Region frozen until ice ball extended beyond lesion: Yes   Outcome: patient tolerated procedure well with no complications   Post-procedure details: wound care instructions given    Chalazion of right lower eyelid Right lower eyelid Start Doxycyline 100 mg take 1 tablet twice a day x 1 week   Doxycycline should be taken with food to prevent nausea. Do not lay down for 30 minutes after taking. Be cautious with sun exposure and use good sun protection while on this medication. Pregnant women should not take this medication.    Discussed with pt see her ophthalmologist if area on eyelid persist   Ordered Medications: doxycycline (VIBRA-TABS) 100 MG tablet  Return if symptoms worsen or fail to improve.  IMarye Round, CMA, am acting as scribe for Sarina Ser, MD . Documentation: I have reviewed the above documentation for accuracy and completeness, and I agree with the above.  Sarina Ser, MD

## 2021-05-26 ENCOUNTER — Encounter: Payer: Self-pay | Admitting: Dermatology

## 2021-08-14 ENCOUNTER — Ambulatory Visit (INDEPENDENT_AMBULATORY_CARE_PROVIDER_SITE_OTHER): Payer: Medicare Other | Admitting: Dermatology

## 2021-08-14 ENCOUNTER — Other Ambulatory Visit: Payer: Self-pay

## 2021-08-14 DIAGNOSIS — L821 Other seborrheic keratosis: Secondary | ICD-10-CM | POA: Diagnosis not present

## 2021-08-14 DIAGNOSIS — L719 Rosacea, unspecified: Secondary | ICD-10-CM | POA: Diagnosis not present

## 2021-08-14 DIAGNOSIS — L720 Epidermal cyst: Secondary | ICD-10-CM | POA: Diagnosis not present

## 2021-08-14 DIAGNOSIS — L820 Inflamed seborrheic keratosis: Secondary | ICD-10-CM

## 2021-08-14 MED ORDER — METRONIDAZOLE 0.75 % EX CREA: TOPICAL_CREAM | CUTANEOUS | 11 refills | Status: AC

## 2021-08-14 NOTE — Patient Instructions (Addendum)
Instructions for [facility] Medications  One or more of your medications was sent to the [facility] mail order compounding pharmacy. You will receive an email from them and can purchase the medicine through that link. It will then be mailed to your home at the address you confirmed. If for any reason you do not receive an email from them, please check your spam folder. If you still do not find the email, please let us know. [facility] phone number is [phone].  Seborrheic Keratosis  What causes seborrheic keratoses? Seborrheic keratoses are harmless, common skin growths that first appear during adult life.  As time goes by, more growths appear.  Some people may develop a large number of them.  Seborrheic keratoses appear on both covered and uncovered body parts.  They are not caused by sunlight.  The tendency to develop seborrheic keratoses can be inherited.  They vary in color from skin-colored to gray, brown, or even black.  They can be either smooth or have a rough, warty surface.   Seborrheic keratoses are superficial and look as if they were stuck on the skin.  Under the microscope this type of keratosis looks like layers upon layers of skin.  That is why at times the top layer may seem to fall off, but the rest of the growth remains and re-grows.    Treatment Seborrheic keratoses do not need to be treated, but can easily be removed in the office.  Seborrheic keratoses often cause symptoms when they rub on clothing or jewelry.  Lesions can be in the way of shaving.  If they become inflamed, they can cause itching, soreness, or burning.  Removal of a seborrheic keratosis can be accomplished by freezing, burning, or surgery. If any spot bleeds, scabs, or grows rapidly, please return to have it checked, as these can be an indication of a skin cancer.     If you have any questions or concerns for your doctor, please call our main line at [phone] and press option 4 to reach  your doctor's medical assistant. If no one answers, please leave a voicemail as directed and we will return your call as soon as possible. Messages left after 4 pm will be answered the following business day.   You may also send [place] a message via [place]. We typically respond to MyChart messages within 1-2 business days.  For prescription refills, please ask your pharmacy to contact our office. Our fax number is [phone].  If you have an urgent issue when the clinic is closed that cannot wait until the next business day, you can page your doctor at the number below.    Please note that while we do our best to be available for urgent issues outside of office hours, we are not available 24/7.   If you have an urgent issue and are unable to reach [place], you may choose to seek medical care at your doctor's office, retail clinic, urgent care center, or emergency room.  If you have a medical emergency, please immediately call 911 or go to the emergency department.  Pager Numbers  - Dr. Nehemiah Massed: [phone]  - Dr. Laurence Ferrari: [phone]  - Dr. Nicole Kindred: [phone]  In the event of inclement weather, please call our main line at [phone] for an update on the status of any delays or closures.  Dermatology Medication Tips: Please keep the boxes that topical medications come in in order to help keep track of the instructions about where and how to  use these. Pharmacies typically print the medication instructions only on the boxes and not directly on the medication tubes.   If your medication is too expensive, please contact our office at [phone] option 4 or send [place] a message through [place].   We are unable to tell what your co-pay for medications will be in advance as this is different depending on your insurance coverage. However, we may be able to find a substitute medication at lower cost or fill out paperwork to get insurance to cover a needed medication.   If a prior authorization  is required to get your medication covered by your insurance company, please allow [place] 1-2 business days to complete this process.  Drug prices often vary depending on where the prescription is filled and some pharmacies may offer cheaper prices.  The website www.goodrx.com contains coupons for medications through different pharmacies. The prices here do not account for what the cost may be with help from insurance (it may be cheaper with your insurance), but the website can give you the price if you did not use any insurance.  - You can print the associated coupon and take it with your prescription to the pharmacy.  - You may also stop by our office during regular business hours and pick up a GoodRx coupon card.  - If you need your prescription sent electronically to a different pharmacy, notify our office through [facility] or by phone at [phone] option 4.

## 2021-08-14 NOTE — Progress Notes (Signed)
   Follow-Up Visit   Subjective  Betty Reynolds is a 75 y.o. female who presents for the following: Follow-up (Patient here today concerning some itchy skin tags at right neck and would like to have nose examined. Patient states broke out on nose about a month ago. ).  The following portions of the chart were reviewed this encounter and updated as appropriate:  Tobacco  Allergies  Meds  Problems  Med Hx  Surg Hx  Fam Hx     Objective  Well appearing patient in no apparent distress; mood and affect are within normal limits.  A focused examination was performed including face, nose, neck. Relevant physical exam findings are noted in the Assessment and Plan.  nose Discoloration at nose   right lateral base of neck 0.7 x 0.5 cm subcutaneous nodule.   face x 18 (18) Erythematous keratotic or waxy stuck-on papule or plaque.   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 It is treatable but not curable. It sometimes affects the eyes (ocular rosacea) as well. It may respond to topical and/or systemic medication and can flare with stress, sun exposure, alcohol, exercise and some foods.  Daily application of broad spectrum spf 30+ sunscreen to face is recommended to reduce flares  Will prescribe [facility] metronidazole/ivermectin/azelaic acid twice daily as needed to affected areas on the face. The patient was advised this is not covered by insurance since it is made by a compounding pharmacy. They will receive an email to check out and the medication will be mailed to their home.   Or Metronidazole topically qhs to bid.  Epidermal inclusion cyst right lateral base of neck Benign-appearing. Okay to keep unless bothersome.  Discussed surgical option.   Observation.  Call  clinic for new or changing moles.  Recommend daily use of broad spectrum spf 30+ sunscreen to sun-exposed areas  Inflamed seborrheic keratosis face x 18 Patient states areas on faces are irritating and bothersome Destruction of lesion - face x 18  Destruction method: electrodesiccation   Destruction method comment:  Without curettage Informed consent: discussed and consent obtained   Timeout:  patient name, date of birth, surgical site, and procedure verified Hemostasis achieved with:  electrodesiccation Outcome: patient tolerated procedure well with no complications   Post-procedure details: wound care instructions given    Seborrheic Keratoses - Stuck-on, waxy, tan-brown papules and/or plaques  - Benign-appearing - Discussed benign etiology and prognosis. - Observe - Call for any changes  Return for 3 - 4 month follow up . IRuthell Rummage, CMA, am acting as scribe for Sarina Ser, MD. Documentation: I have reviewed the above documentation for accuracy and completeness, and I agree with the above.  Sarina Ser, MD

## 2021-08-17 ENCOUNTER — Encounter: Payer: Self-pay | Admitting: Dermatology

## 2021-12-01 ENCOUNTER — Ambulatory Visit (INDEPENDENT_AMBULATORY_CARE_PROVIDER_SITE_OTHER): Payer: Medicare Other | Admitting: Dermatology

## 2021-12-01 ENCOUNTER — Other Ambulatory Visit: Payer: Self-pay

## 2021-12-01 DIAGNOSIS — D170 Benign lipomatous neoplasm of skin and subcutaneous tissue of head, face and neck: Secondary | ICD-10-CM | POA: Diagnosis not present

## 2021-12-01 DIAGNOSIS — D485 Neoplasm of uncertain behavior of skin: Secondary | ICD-10-CM

## 2021-12-01 DIAGNOSIS — D2372 Other benign neoplasm of skin of left lower limb, including hip: Secondary | ICD-10-CM

## 2021-12-01 DIAGNOSIS — D239 Other benign neoplasm of skin, unspecified: Secondary | ICD-10-CM | POA: Diagnosis not present

## 2021-12-01 DIAGNOSIS — L821 Other seborrheic keratosis: Secondary | ICD-10-CM

## 2021-12-01 DIAGNOSIS — L820 Inflamed seborrheic keratosis: Secondary | ICD-10-CM | POA: Diagnosis not present

## 2021-12-01 NOTE — Patient Instructions (Addendum)

## 2021-12-01 NOTE — Progress Notes (Signed)
Follow-Up Visit   Subjective  Betty Reynolds is a 75 y.o. female who presents for the following: Cyst (Patient here today to follow up for cyst at right neck. Patient also with a spot at left lower leg that she has scratched at and would like to make sure it's ok. Patient had ISK treated at last visit at right neck and advises there is residual. The patient has spots, moles and lesions to be evaluated, some may be new or changing and the patient has concerns that these could be cancer./). The patient has spots, moles and lesions to be evaluated, some may be new or changing and the patient has concerns that these could be cancer.  The following portions of the chart were reviewed this encounter and updated as appropriate:   Tobacco  Allergies  Meds  Problems  Med Hx  Surg Hx  Fam Hx     Review of Systems:  No other skin or systemic complaints except as noted in HPI or Assessment and Plan.  Objective  Well appearing patient in no apparent distress; mood and affect are within normal limits.  A focused examination was performed including face, neck, left lower leg. Relevant physical exam findings are noted in the Assessment and Plan.  forehead Dilated pore  right lateral base of neck 0.7 x 0.5 cm flesh colored papule Nevus vs Cyst  right inframandibular Erythematous keratotic or waxy stuck-on papule or plaque.   left proximal medial calf Firm pink/brown papulenodule with dimple sign 0.5 cm   Assessment & Plan  Dilated pore of [place] forehead Benign-appearing.  Observation.  Call clinic for new or changing lesions.  Recommend daily use of broad spectrum spf 30+ sunscreen to sun-exposed areas.   Neoplasm of uncertain behavior of skin right lateral base of neck Epidermal / dermal shaving  Lesion diameter (cm):  0.7 Informed consent: discussed and consent obtained   Timeout: patient name, date of birth, surgical site, and procedure verified   Procedure prep:  Patient was  prepped and draped in usual sterile fashion Prep type:  Isopropyl alcohol Anesthesia: the lesion was anesthetized in a standard fashion   Anesthetic:  1% lidocaine w/ epinephrine 1-100,000 buffered w/ 8.4% NaHCO3 Instrument used: flexible razor blade   Hemostasis achieved with: pressure, aluminum chloride and electrodesiccation   Outcome: patient tolerated procedure well   Post-procedure details: sterile dressing applied and wound care instructions given   Dressing type: bandage and petrolatum    Specimen 1 - Surgical pathology Differential Diagnosis: Nevus vs Cyst Check Margins: No 0.7 x 0.5 cm flesh colored papule  Inflamed seborrheic keratosis right inframandibular Destruction of lesion - right inframandibular Complexity: simple   Destruction method comment:  Electrodessication without curettage Informed consent: discussed and consent obtained   Timeout:  patient name, date of birth, surgical site, and procedure verified Procedure prep:  Patient was prepped and draped in usual sterile fashion Prep type:  Isopropyl alcohol Hemostasis achieved with:  electrodesiccation Outcome: patient tolerated procedure well with no complications   Post-procedure details: sterile dressing applied and wound care instructions given   Dressing type: petrolatum    Dermatofibroma left proximal medial calf Benign-appearing.  Observation.  Call clinic for new or changing lesions.  Recommend daily use of broad spectrum spf 30+ sunscreen to sun-exposed areas.   Seborrheic Keratoses - Stuck-on, waxy, tan-brown papules and/or plaques  - Benign-appearing - Discussed benign etiology and prognosis. - Observe - Call for any changes  Return if symptoms worsen or  fail to improve.  Graciella Belton, RMA, am acting as scribe for Sarina Ser, MD .  Documentation: I have reviewed the above documentation for accuracy and completeness, and I agree with the above.  Sarina Ser, MD

## 2021-12-04 ENCOUNTER — Telehealth: Payer: Self-pay

## 2021-12-04 NOTE — Telephone Encounter (Signed)
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

## 2021-12-04 NOTE — Telephone Encounter (Signed)
Advised pt of bx results/sh ?

## 2021-12-04 NOTE — Telephone Encounter (Signed)
Advised patient of results/hd  

## 2021-12-10 ENCOUNTER — Encounter: Payer: Self-pay | Admitting: Dermatology

## 2022-01-14 ENCOUNTER — Encounter: Payer: Self-pay | Admitting: Podiatry

## 2022-01-14 ENCOUNTER — Ambulatory Visit (INDEPENDENT_AMBULATORY_CARE_PROVIDER_SITE_OTHER): Payer: Medicare Other | Admitting: Podiatry

## 2022-01-14 ENCOUNTER — Other Ambulatory Visit: Payer: Self-pay

## 2022-01-14 DIAGNOSIS — Q828 Other specified congenital malformations of skin: Secondary | ICD-10-CM

## 2022-01-14 NOTE — Patient Instructions (Signed)
Look for urea 35% or salicylic acid 33% cream or ointment and apply to the thickened dry skin / calluses. This can be bought over the counter, at a pharmacy or online such as [employer].You can also buy the urea cream from my office

## 2022-01-18 ENCOUNTER — Encounter: Payer: Self-pay | Admitting: Podiatry

## 2022-01-18 NOTE — Progress Notes (Signed)
°  Subjective:  Patient ID: Betty Reynolds, female    DOB: Oct 08, 1946,  MRN: [id number]  Chief Complaint  Patient presents with   Foot Pain      New pt- has something in her Left foot    76 y.o. female presents with the above complaint. History confirmed with patient.  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 Porokeratosis      Plan:  Patient was evaluated and treated and all questions answered.  Discussed etiology and treatment options of porokeratosis in detail with her.  We discussed preventative maintenance using a pedicurist to maintain them as well as intermittent debridement.  Discussed with her that she would not qualify for routine foot care for this.  I debrided as a courtesy today.  We discussed using aperture pads and urea cream to alleviate the lesions.  She will follow-up as needed.  Return if symptoms worsen or fail to improve.

## 2022-02-18 ENCOUNTER — Other Ambulatory Visit: Payer: Self-pay

## 2022-02-18 ENCOUNTER — Ambulatory Visit (INDEPENDENT_AMBULATORY_CARE_PROVIDER_SITE_OTHER): Payer: Medicare Other | Admitting: Podiatry

## 2022-02-18 ENCOUNTER — Encounter: Payer: Self-pay | Admitting: Podiatry

## 2022-02-18 DIAGNOSIS — M79672 Pain in left foot: Secondary | ICD-10-CM | POA: Diagnosis not present

## 2022-02-18 DIAGNOSIS — Q828 Other specified congenital malformations of skin: Secondary | ICD-10-CM

## 2022-02-18 NOTE — Progress Notes (Signed)
°  Subjective:  Patient ID: [name], female    DOB: Apr 10, 1946,  MRN: [id number]  Chief Complaint  Patient presents with   Callouses    6 week follow up, painful callus lesions    76 y.o. female presents with the above complaint. History confirmed with patient.  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 Porokeratosis   2. Left foot pain      Plan:  Patient was evaluated and treated and all questions answered.  Doing much better.  Advised this should continue to improve it may take another couple months for her to get where she wants to be.  Continue using urea cream and she can get this on [place] or at the pharmacy if she can find it.  Also advised she can get it from our office.  I debrided one of the central lesions as a courtesy again today but we discussed the Medicare would not cover this in the future.  She will return to see me on an as-needed basis.  Return if symptoms worsen or fail to improve.

## 2022-03-16 ENCOUNTER — Other Ambulatory Visit: Payer: Self-pay

## 2022-03-16 ENCOUNTER — Ambulatory Visit (INDEPENDENT_AMBULATORY_CARE_PROVIDER_SITE_OTHER): Payer: Medicare Other | Admitting: Podiatry

## 2022-03-16 ENCOUNTER — Encounter: Payer: Self-pay | Admitting: Podiatry

## 2022-03-16 DIAGNOSIS — Q828 Other specified congenital malformations of skin: Secondary | ICD-10-CM | POA: Diagnosis not present

## 2022-03-16 DIAGNOSIS — M79672 Pain in left foot: Secondary | ICD-10-CM | POA: Diagnosis not present

## 2022-03-16 DIAGNOSIS — L923 Foreign body granuloma of the skin and subcutaneous tissue: Secondary | ICD-10-CM

## 2022-03-18 NOTE — Progress Notes (Signed)
?  Subjective:  ?Patient ID: Betty Reynolds, female    DOB: [birth year],  MRN: [id number] ? ?Chief Complaint  ?Patient presents with  ? Callouses  ?  Painful porokeratosis bottom of left foot  ? ? ?76 y.o. female returns for follow-up with the above complaint. History confirmed with patient.  She still having pain under the 1 lesion under the left foot.  She is concerned there may be something stuck in there although she does not recall a specific incident where a foreign body penetrated the foot ? ?Objective:  ?Physical Exam: ?warm, good capillary refill, no trophic changes or ulcerative lesions, normal DP and PT pulses, normal sensory exam, and multiple porokeratosis 1 in the central midfoot is quite painful, debridement do not see any evidence of foreign body ?Assessment:  ? ?1. Porokeratosis   ?2. Foreign body granuloma of skin and subcutaneous tissue   ?3. Left foot pain   ? ? ? ?Plan:  ?Patient was evaluated and treated and all questions answered. ? ?Today again discussed with her that these are chronic skin lesions that require a cream such as urea and routine regular debridements and unfortunately her Medicare does not qualify her for this as she does not have a diagnosis of diabetes vascular disease or chronic anticoagulation and puts her at risk but needs medical debridement.  She is mostly concerned there could be a foreign body and I think a ultrasound of the area is reasonable and I have ordered this.  She will follow-up with me after the ultrasound and consider further treatment options if there is a foreign body in.  ABN was signed and placed on file for debridement of the porokeratosis ? ?Return if symptoms worsen or fail to improve, for after ultrasound to review.  ? ?

## 2022-03-25 ENCOUNTER — Ambulatory Visit: Payer: Medicare Other | Admitting: Podiatry

## 2022-08-04 ENCOUNTER — Ambulatory Visit (INDEPENDENT_AMBULATORY_CARE_PROVIDER_SITE_OTHER): Payer: Medicare Other | Admitting: Dermatology

## 2022-08-04 DIAGNOSIS — L719 Rosacea, unspecified: Secondary | ICD-10-CM | POA: Diagnosis not present

## 2022-08-04 DIAGNOSIS — R21 Rash and other nonspecific skin eruption: Secondary | ICD-10-CM | POA: Diagnosis not present

## 2022-08-04 MED ORDER — MUPIROCIN 2 % EX OINT: 1.0000 | TOPICAL_OINTMENT | Freq: Two times a day (BID) | CUTANEOUS | 0 refills | Status: AC

## 2022-08-04 MED ORDER — CEPHALEXIN 500 MG PO CAPS: 500.0000 mg | ORAL_CAPSULE | Freq: Three times a day (TID) | ORAL | 0 refills | Status: AC

## 2022-08-04 MED ORDER — CEPHALEXIN 500 MG PO CAPS: 500.0000 mg | ORAL_CAPSULE | Freq: Three times a day (TID) | ORAL | 0 refills | Status: DC

## 2022-08-04 MED ORDER — DOXYCYCLINE HYCLATE 20 MG PO TABS: 20.0000 mg | ORAL_TABLET | Freq: Two times a day (BID) | ORAL | 4 refills | Status: AC

## 2022-08-04 NOTE — Progress Notes (Signed)
Follow-Up Visit   Subjective  Betty Reynolds is a 76 y.o. female who presents for the following: Rash (Patient here today concerning a rash at left back that started Sunday. Reports rash is itchy and has been painful. Has been using Caldasene powder to affected areas but not going away. ) and Skin Problem (Patient reports bumps on face and nose and changes in texture of nose. Patient would like to discuss. ).   The following portions of the chart were reviewed this encounter and updated as appropriate:  Tobacco  Allergies  Meds  Problems  Med Hx  Surg Hx  Fam Hx      Review of Systems: No other skin or systemic complaints except as noted in HPI or Assessment and Plan.   Objective  Well appearing patient in no apparent distress; mood and affect are within normal limits.  A focused examination was performed including back, face, nose, cheeks. Relevant physical exam findings are noted in the Assessment and Plan.  left upper back Superficial ulceration with surrounding erythematous edematous plaque   nose and mid cheeks Mid face erythema with telangiectasias +/- scattered inflammatory papules.       Assessment & Plan  Rash and other nonspecific skin eruption left upper back  Favor bug bite vs infection   Anaerobic and Aerobic Culture performed in office to r/o infection  Does not look like shingles today  No labs available for review. Patient denies history of kidney problems.   Start Cephalexin 500 mg capsule - take 1 cap po tid for 5 days.   Start Mupirocin ointment 2 % - apply topically twice daily and cover with bandage until healed or clear.    Anaerobic and Aerobic Culture - left upper back  mupirocin ointment (BACTROBAN) 2 % - left upper back Apply 1 Application topically 2 (two) times daily. And cover with bandage until healed.  Related Medications cephALEXin (KEFLEX) 500 MG capsule Take 1 capsule (500 mg total) by mouth 3 (three) times daily for 5  days.  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 Condition is bothersome/symptomatic for patient. Currently flared.  Rhinophymatous type  Rosacea is a chronic progressive skin condition usually affecting the face of adults, causing redness and/or acne bumps. It is treatable but not curable. It sometimes affects the eyes (ocular rosacea) as well. It may respond to topical and/or systemic medication and can flare with stress, sun exposure, alcohol, exercise and some foods.  Daily application of broad spectrum spf 30+ sunscreen to face is recommended to reduce flares.  Start Doxycycline 20 mg tab - take 1 tab po bid with food and drink.   Doxycycline should be taken with food to prevent nausea. Do not lay down for 30 minutes after taking. Be cautious with sun exposure and use good sun protection while on this medication. Pregnant women should not take this medication.   Discussed starting accutane treatment, will consider at next 3 - 4 month follow up   doxycycline (PERIOSTAT) 20 MG tablet - nose and mid cheeks Take 1 tablet (20 mg total) by mouth 2 (two) times daily. Take with food and drink. Doxycycline should be taken with food to prevent nausea. Do not lay down for 30 minutes after taking. Be cautious with sun exposure and use good sun protection while on this medication.  Related Medications metroNIDAZOLE (METROCREAM) 0.75 % cream Apply topically See admin instructions. Once or twice daily at  affected areas of nose   Return for 3 - 4 month rosacea follow up. I, Ruthell Rummage, CMA, am acting as scribe for Forest Gleason, MD.  Documentation: I have reviewed the above documentation for accuracy and completeness, and I agree with the above.  Forest Gleason, MD

## 2022-08-04 NOTE — Patient Instructions (Addendum)
For rash at back Start Cephalexin 500 mg by mouth three times daily for 5 days.  Apply mupirocin ointment topically twice daily and cover with bandage until healed or clear.   If back is not feeling better by Thursday give [place] a call.   For Rosacea at [place] before starting, finish cephalexin before starting doxycycline 20 mg by mouth twice a day with food and drink.   Doxycycline should be taken with food to prevent nausea. Do not lay down for 30 minutes after taking. Be cautious with sun exposure and use good sun protection while on this medication. Pregnant women should not take this medication.   Rosacea  What is rosacea? Rosacea (say: ro-zay-sha) is a common skin disease that usually begins as a trend of flushing or blushing easily.  As rosacea progresses, a persistent redness in the center of the face will develop and may gradually spread beyond the nose and cheeks to the forehead and chin.  In some cases, the ears, chest, and back could be affected.  Rosacea may appear as tiny blood vessels or small red bumps that occur in crops.  Frequently they can contain pus, and are called "pustules".  If the bumps do not contain pus, they are referred to as "papules".  Rarely, in prolonged, untreated cases of rosacea, the oil glands of the nose and cheeks may become permanently enlarged.  This is called rhinophyma, and is seen more frequently in men.  Signs and Risks In its beginning stages, rosacea tends to come and go, which makes it difficult to recognize.  It can start as intermittent flushing of the face.  Eventually, blood vessels may become permanently visible.  Pustules and papules can appear, but can be mistaken for adult acne.  People of all races, ages, genders and ethnic groups are at risk of developing rosacea.  However, it is more common in women (especially around menopause) and adults with fair skin between the ages of 11 and 28.  Treatment Dermatologists typically recommend a  combination of treatments to effectively manage rosacea.  Treatment can improve symptoms and may stop the progression of the rosacea.  Treatment may involve both topical and oral medications.  The tetracycline antibiotics are often used for their anti-inflammatory effect; however, because of the possibility of developing antibiotic resistance, they should not be used long term at full dose.  For dilated blood vessels the options include electrodessication (uses electric current through a small needle), laser treatment, and cosmetics to hide the redness.   With all forms of treatment, improvement is a slow process, and patients may not see any results for the first 3-4 weeks.  It is very important to avoid the sun and other triggers.  Patients must wear sunscreen daily.  Skin Care Instructions: Cleanse the skin with a mild soap such as CeraVe cleanser, Cetaphil cleanser, or Dove soap once or twice daily as needed. Moisturize with Eucerin Redness Relief Daily Perfecting Lotion (has a subtle green tint), CeraVe Moisturizing Cream, or Oil of Olay Daily Moisturizer with sunscreen every morning and/or night as recommended. Makeup should be "non-comedogenic" (won't clog pores) and be labeled "for sensitive skin". Good choices for cosmetics are: Neutrogena, Almay, and Physician's Formula.  Any product with a green tint tends to offset a red complexion. If your eyes are dry and irritated, use artificial tears 2-3 times per day and cleanse the eyelids daily with baby shampoo.  Have your eyes examined at least every 2 years.  Be  sure to tell your eye doctor that you have rosacea. Alcoholic beverages tend to cause flushing of the skin, and may make rosacea worse. Always wear sunscreen, protect your skin from extreme hot and cold temperatures, and avoid spicy foods, hot drinks, and mechanical irritation such as rubbing, scrubbing, or massaging the face.  Avoid harsh skin cleansers, cleansing masks, astringents, and  exfoliation. If a particular product burns or makes your face feel tight, then it is likely to flare your rosacea. If you are having difficulty finding a sunscreen that you can tolerate, you may try switching to a chemical-free sunscreen.  These are ones whose active ingredient is zinc oxide or titanium dioxide only.  They should also be fragrance free, non-comedogenic, and labeled for sensitive skin. Rosacea triggers may vary from person to person.  There are a variety of foods that have been reported to trigger rosacea.  Some patients find that keeping a diary of what they were doing when they flared helps them avoid triggers.     Due to recent changes in healthcare laws, you may see results of your pathology and/or laboratory studies on MyChart before the doctors have had a chance to review them. We understand that in some cases there may be results that are confusing or concerning to you. Please understand that not all results are received at the same time and often the doctors may need to interpret multiple results in order to provide you with the best plan of care or course of treatment. Therefore, we ask that you please give [place] 2 business days to thoroughly review all your results before contacting the office for clarification. Should we see a critical lab result, you will be contacted sooner.   If You Need Anything After Your Visit  If you have any questions or concerns for your doctor, please call our main line at [phone] and press option 4 to reach your doctor's medical assistant. If no one answers, please leave a voicemail as directed and we will return your call as soon as possible. Messages left after 4 pm will be answered the following business day.   You may also send [place] a message via [place]. We typically respond to MyChart messages within 1-2 business days.  For prescription refills, please ask your pharmacy to contact our office. Our fax number is [phone].  If you have  an urgent issue when the clinic is closed that cannot wait until the next business day, you can page your doctor at the number below.    Please note that while we do our best to be available for urgent issues outside of office hours, we are not available 24/7.   If you have an urgent issue and are unable to reach [place], you may choose to seek medical care at your doctor's office, retail clinic, urgent care center, or emergency room.  If you have a medical emergency, please immediately call 911 or go to the emergency department.  Pager Numbers  - Dr. Nehemiah Massed: [phone]  - Dr. Laurence Ferrari: [phone]  - Dr. Nicole Kindred: [phone]  In the event of inclement weather, please call our main line at [phone] for an update on the status of any delays or closures.  Dermatology Medication Tips: Please keep the boxes that topical medications come in in order to help keep track of the instructions about where and how to use these. Pharmacies typically print the medication instructions only on the boxes and not directly on the medication tubes.   If your  medication is too expensive, please contact our office at [phone] option 4 or send [place] a message through [place].   We are unable to tell what your co-pay for medications will be in advance as this is different depending on your insurance coverage. However, we may be able to find a substitute medication at lower cost or fill out paperwork to get insurance to cover a needed medication.   If a prior authorization is required to get your medication covered by your insurance company, please allow [place] 1-2 business days to complete this process.  Drug prices often vary depending on where the prescription is filled and some pharmacies may offer cheaper prices.  The website www.goodrx.com contains coupons for medications through different pharmacies. The prices here do not account for what the cost may be with help from insurance (it may be cheaper with  your insurance), but the website can give you the price if you did not use any insurance.  - You can print the associated coupon and take it with your prescription to the pharmacy.  - You may also stop by our office during regular business hours and pick up a GoodRx coupon card.  - If you need your prescription sent electronically to a different pharmacy, notify our office through [facility] or by phone at [phone] option 4.     Si Usted Necesita Algo Despus de Su Visita  Tambin puede enviarnos un mensaje a travs de Pharmacist, community. Por lo general respondemos a los mensajes de MyChart en el transcurso de 1 a 2 das hbiles.  Para renovar recetas, por favor pida a su farmacia que se ponga en contacto con nuestra oficina. Harland Dingwall de fax es [place] [phone].  Si tiene un asunto urgente cuando la clnica est cerrada y que no puede esperar hasta el siguiente da hbil, puede llamar/localizar a su doctor(a) al nmero que aparece a continuacin.   Por favor, tenga en cuenta que aunque hacemos todo lo posible para estar disponibles para asuntos urgentes fuera del horario de [place], no estamos disponibles las 24 horas del da, los 7 das de la [place].   Si tiene un problema urgente y no puede comunicarse con nosotros, puede optar por buscar atencin mdica  en el consultorio de su doctor(a), en una clnica privada, en un centro de atencin urgente o en una sala de emergencias.  Si tiene Engineering geologist, por favor llame inmediatamente al 911 o vaya a la sala de emergencias.  Nmeros de bper  - Dr. Nehemiah Massed: [phone]  - Dra. Moye: [phone]  - Dra. Nicole Kindred: [phone]  En caso de inclemencias del [place], por favor llame a Johnsie Kindred principal al [phone] para una actualizacin sobre el [place] de cualquier retraso o cierre.  Consejos para la medicacin en dermatologa: Por favor, guarde las cajas en las que vienen los medicamentos de uso tpico para  ayudarle a seguir las instrucciones sobre dnde y cmo usarlos. Las farmacias generalmente imprimen las instrucciones del medicamento slo en las cajas y no directamente en los tubos del [place].   Si su medicamento es muy caro, por favor, pngase en contacto con Zigmund Daniel llamando al [phone] y presione la opcin 4 o envenos un mensaje a travs de Pharmacist, community.   No podemos decirle cul ser su copago por los medicamentos por adelantado ya que esto es diferente dependiendo de la cobertura de su seguro. Sin embargo, es posible que podamos encontrar un medicamento sustituto a Electrical engineer un formulario para  que el seguro cubra el medicamento que se considera necesario.   Si se requiere una autorizacin previa para que su compaa de seguros [place] su medicamento, por favor permtanos de 1 a 2 das hbiles para completar este proceso.  Los precios de los medicamentos varan con frecuencia dependiendo del Environmental consultant de dnde se surte la receta y alguna farmacias pueden ofrecer precios ms baratos.  El sitio web www.goodrx.com tiene cupones para medicamentos de Airline pilot. Los precios aqu no tienen en cuenta lo que podra costar con la ayuda del seguro (puede ser ms barato con su seguro), pero el sitio web puede darle el precio si no utiliz Research scientist (physical sciences).  - Puede imprimir el cupn correspondiente y llevarlo con su receta a la farmacia.  - Tambin puede pasar por nuestra oficina durante el horario de atencin regular y Charity fundraiser una tarjeta de cupones de GoodRx.  - Si necesita que su receta se enve electrnicamente a una farmacia diferente, informe a nuestra oficina a travs de MyChart de [place] o por telfono llamando al [phone] y presione la opcin 4.

## 2022-08-17 ENCOUNTER — Encounter: Payer: Self-pay | Admitting: Dermatology

## 2022-08-20 ENCOUNTER — Telehealth: Payer: Self-pay

## 2022-08-20 LAB — BODY FLUID CULTURE AER/ANAER

## 2022-08-20 LAB — SPECIMEN STATUS REPORT

## 2022-08-20 LAB — RESULT

## 2022-08-20 NOTE — Telephone Encounter (Signed)
-----   Message from Alfonso Patten, MD sent at 08/20/2022 11:37 AM EDT ----- Culture grew Enterobacter cloacae complex bacteria, Heavy growth.  If she is doing well, no additional treatment is needed, but if the rash has not cleared up or if she has new spots, would treat with doxycycline 100 mg twice a day for 7 days. Doxycycline should be taken with food to prevent nausea. Do not lay down for 30 minutes after taking. Be cautious with sun exposure and use good sun protection while on this medication. Pregnant women should not take this medication.    MAs please call. Thank you!

## 2022-08-24 ENCOUNTER — Telehealth: Payer: Self-pay | Admitting: Podiatry

## 2022-08-24 ENCOUNTER — Telehealth: Payer: Self-pay

## 2022-08-24 NOTE — Telephone Encounter (Signed)
Pt left message on 8.25 @ 317pm stating she needed an appt that she has something in her foot.  I returned call and she stated that she thinks it is a sliver of glass in her foot and I have scheduled her to see Dr Posey Pronto on 8.29.2023 as her regular doctor ([facility]) has no availability for a few weeks and she asked to see a different provider instead of waiting.

## 2022-08-24 NOTE — Telephone Encounter (Signed)
-----   Message from Alfonso Patten, MD sent at 08/20/2022 11:37 AM EDT ----- Culture grew Enterobacter cloacae complex bacteria, Heavy growth.  If she is doing well, no additional treatment is needed, but if the rash has not cleared up or if she has new spots, would treat with doxycycline 100 mg twice a day for 7 days. Doxycycline should be taken with food to prevent nausea. Do not lay down for 30 minutes after taking. Be cautious with sun exposure and use good sun protection while on this medication. Pregnant women should not take this medication.    MAs please call. Thank you!

## 2022-08-24 NOTE — Telephone Encounter (Signed)
Pt called requesting bacterial culture results discussed Culture grew Enterobacter cloacae complex bacteria, Heavy growth, pt report she is doing much better so no additional treatment is needed.

## 2022-08-25 ENCOUNTER — Ambulatory Visit (INDEPENDENT_AMBULATORY_CARE_PROVIDER_SITE_OTHER): Payer: Medicare Other | Admitting: Podiatry

## 2022-08-25 DIAGNOSIS — Q828 Other specified congenital malformations of skin: Secondary | ICD-10-CM | POA: Diagnosis not present

## 2022-08-27 NOTE — Progress Notes (Signed)
  Subjective:  Patient ID: [name], female    DOB: 1946/03/26,  MRN: [id number]  Chief Complaint  Patient presents with   Callouses    76 y.o. female presents with the above complaint. History confirmed with patient.  She did not has any other acute, she states has been dealing with this for quite some time.  She is known to Dr. Sherryle Lis.    Objective:  Physical Exam: warm, good capillary refill, no trophic changes or ulcerative lesions, normal DP and PT pulses, normal sensory exam, and multiple porokeratosis left submet 5 being the most painful. Assessment:   1. Porokeratosis       Plan:  Patient was evaluated and treated and all questions answered.  Discussed etiology and treatment options of porokeratosis in detail with her.  The lesion was debrided down as a courtesy today.  I discussed offloading and shoe gear modification as well.  Patient states understanding.  No follow-ups on file.

## 2022-09-17 ENCOUNTER — Encounter: Payer: Self-pay | Admitting: Dermatology

## 2022-09-17 ENCOUNTER — Ambulatory Visit (INDEPENDENT_AMBULATORY_CARE_PROVIDER_SITE_OTHER): Payer: Medicare Other | Admitting: Dermatology

## 2022-09-17 DIAGNOSIS — Z79899 Other long term (current) drug therapy: Secondary | ICD-10-CM

## 2022-09-17 DIAGNOSIS — D492 Neoplasm of unspecified behavior of bone, soft tissue, and skin: Secondary | ICD-10-CM

## 2022-09-17 DIAGNOSIS — L700 Acne vulgaris: Secondary | ICD-10-CM

## 2022-09-17 DIAGNOSIS — R21 Rash and other nonspecific skin eruption: Secondary | ICD-10-CM | POA: Diagnosis not present

## 2022-09-17 DIAGNOSIS — L820 Inflamed seborrheic keratosis: Secondary | ICD-10-CM | POA: Diagnosis not present

## 2022-09-17 DIAGNOSIS — L720 Epidermal cyst: Secondary | ICD-10-CM | POA: Diagnosis not present

## 2022-09-17 DIAGNOSIS — L719 Rosacea, unspecified: Secondary | ICD-10-CM

## 2022-09-17 MED ORDER — HYDROCORTISONE 2.5 % EX CREA: TOPICAL_CREAM | CUTANEOUS | 1 refills | Status: DC

## 2022-09-17 MED ORDER — CLOBETASOL PROPIONATE 0.05 % EX OINT: TOPICAL_OINTMENT | CUTANEOUS | 1 refills | Status: DC

## 2022-09-17 NOTE — Progress Notes (Signed)
Follow-Up Visit   Subjective  Betty Reynolds is a 76 y.o. female who presents for the following: Rash (Hands and arms. Started Sunday. Had been driving and hands/arms were in the sun. Patient is taking Doxycycline '20mg'$  for rosacea. Thinks is "sun poisoning". Saw PCP yesterday. Prescribed Prednisone '10mg'$  dose pack and Triamcinolone cream. She states the rash has improved some. Not itching as badly).  The following portions of the chart were reviewed this encounter and updated as appropriate:  Tobacco  Allergies  Meds  Problems  Med Hx  Surg Hx  Fam Hx      Review of Systems: No other skin or systemic complaints except as noted in HPI or Assessment and Plan.   Objective  Well appearing patient in no apparent distress; mood and affect are within normal limits.  A focused examination was performed including upper extremities, including the arms, hands, fingers, and fingernails. Relevant physical exam findings are noted in the Assessment and Plan.  Left Forearm - Posterior Erythematous papules and patches on dorsal hands and forearms, sparing below watch, and forehead  Left Thigh - Anterior 0.5cm firm scaly erythematous papule     Right lateral Neck 0.3cm firm subcutaneous papule     Head - Anterior (Face) Rhinophymatous changes          Assessment & Plan  Acne vulgaris  Related Procedures Hepatic Function Panel Lipid panel  Rash Left Forearm - Posterior  Favor phototoxic dermatitis, secondary to sun exposure with doxycycline.  Avoid sun exposure while on doxycycline. Plan to switch to isotretinoin for rosacea.   Start Clobetasol ointment twice daily to arms and hands. Avoid applying to face, groin, and axilla. Use as directed. Long-term use can cause thinning of the skin.   Hydrocortisone 2.5% cream twice daily up to 1-2 weeks as needed to forehead.   Topical steroids (such as triamcinolone, fluocinolone, fluocinonide, mometasone, clobetasol,  halobetasol, betamethasone, hydrocortisone) can cause thinning and lightening of the skin if they are used for too long in the same area. Your physician has selected the right strength medicine for your problem and area affected on the body. Please use your medication only as directed by your physician to prevent side effects.    clobetasol ointment (TEMOVATE) 0.05 % - Left Forearm - Posterior Apply twice daily to arms up to 2 weeks as needed for rash/itching. Avoid applying to face, groin, and axilla.  hydrocortisone 2.5 % cream - Left Forearm - Posterior Apply twice daily to forehead 1-2 weeks as needed for rash/itching.  Encounter for long-term (current) use of high-risk medication  Related Procedures Hepatic Function Panel Lipid panel  Neoplasm of skin (2) Left Thigh - Anterior  Epidermal / dermal shaving  Lesion diameter (cm):  0.5 Informed consent: discussed and consent obtained   Patient was prepped and draped in usual sterile fashion: Area prepped with alcohol. Anesthesia: the lesion was anesthetized in a standard fashion   Anesthetic:  1% lidocaine w/ epinephrine 1-100,000 buffered w/ 8.4% NaHCO3 Instrument used: flexible razor blade   Hemostasis achieved with: pressure, aluminum chloride and electrodesiccation   Outcome: patient tolerated procedure well   Post-procedure details: wound care instructions given   Post-procedure details comment:  Ointment and small bandage applied  Specimen 1 - Surgical pathology Differential Diagnosis: R/O ISK > SCC  Check Margins: No  Right lateral Neck  Skin / nail biopsy Type of biopsy: punch   Informed consent: discussed and consent obtained   Timeout: patient name, date of birth,  surgical site, and procedure verified   Procedure prep:  Patient was prepped and draped in usual sterile fashion Prep type:  Isopropyl alcohol Anesthesia: the lesion was anesthetized in a standard fashion   Anesthetic:  1% lidocaine w/ epinephrine  1-100,000 buffered w/ 8.4% NaHCO3 Punch size:  4 mm Suture size:  4-0 Suture type: Prolene (polypropylene)   Hemostasis achieved with: suture   Outcome: patient tolerated procedure well   Post-procedure details: sterile dressing applied and wound care instructions given   Dressing type: bacitracin    Specimen 2 - Surgical pathology Differential Diagnosis: R/O cyst vs other  Check Margins: No  Rosacea Head - Anterior (Face)  Chronic and persistent condition with duration or expected duration over one year. Condition is bothersome/symptomatic for patient. Currently flared.   Rhinophymatous type   Rosacea is a chronic progressive skin condition usually affecting the face of adults, causing redness and/or acne bumps. It is treatable but not curable. It sometimes affects the eyes (ocular rosacea) as well. It may respond to topical and/or systemic medication and can flare with stress, sun exposure, alcohol, exercise and some foods.  Daily application of broad spectrum spf 30+ sunscreen to face is recommended to reduce flares.   Continue Doxycycline 20 mg tab - take 1 tab po bid with food and drink. Stop 5 days prior to starting Isotretinoin.    Doxycycline should be taken with food to prevent nausea. Do not lay down for 30 minutes after taking. Be cautious with sun exposure and use good sun protection while on this medication. Pregnant women should not take this medication.    Discussed starting accutane treatment.  Plan to start Isotretinoin pending normal labs.  Will register in [employer] once labs are received and WNL.   Reviewed potential side effects of isotretinoin including xerosis, cheilitis, hepatitis, hyperlipidemia, and severe birth defects if taken by a pregnant woman. Reviewed reports of suicidal ideation in those with a history of depression while taking isotretinoin and reports of diagnosis of inflammatory bowl disease while taking isotretinoin as well as the lack of evidence  for a causal relationship between isotretinoin, depression and IBD. Patient advised to reach out with any questions or concerns. Patient advised not to share pills or donate blood while on treatment or for one month after completing treatment.    Return in about 2 weeks (around 10/01/2022) for Suture Removal.  I, Emelia Salisbury, CMA, am acting as scribe for Forest Gleason, MD.  Documentation: I have reviewed the above documentation for accuracy and completeness, and I agree with the above.  Forest Gleason, MD

## 2022-09-17 NOTE — Patient Instructions (Addendum)
Wound Care Instructions  Cleanse wound gently with soap and water once a day then pat dry with clean gauze. Apply a thin coat of Petrolatum (petroleum jelly, "Vaseline") over the wound (unless you have an allergy to this). We recommend that you use a new, sterile tube of Vaseline. Do not pick or remove scabs. Do not remove the yellow or white "healing tissue" from the base of the wound.  Cover the wound with fresh, clean, nonstick gauze and secure with paper tape. You may use Band-Aids in place of gauze and tape if the wound is small enough, but would recommend trimming much of the tape off as there is often too much. Sometimes Band-Aids can irritate the skin.  You should call the office for your biopsy report after 1 week if you have not already been contacted.  If you experience any problems, such as abnormal amounts of bleeding, swelling, significant bruising, significant pain, or evidence of infection, please call the office immediately.  FOR ADULT SURGERY PATIENTS: If you need something for pain relief you may take 1 extra strength Tylenol (acetaminophen) AND 2 Ibuprofen ('200mg'$  each) together every 4 hours as needed for pain. (do not take these if you are allergic to them or if you have a reason you should not take them.) Typically, you may only need pain medication for 1 to 3 days.    Rash:  Start Clobetasol ointment twice daily to arms and hands. Avoid applying to face, groin, and axilla. Use as directed. Long-term use can cause thinning of the skin.   Hydrocortisone 2.5% cream twice daily up to 1-2 weeks as needed to forehead.   Topical steroids (such as triamcinolone, fluocinolone, fluocinonide, mometasone, clobetasol, halobetasol, betamethasone, hydrocortisone) can cause thinning and lightening of the skin if they are used for too long in the same area. Your physician has selected the right strength medicine for your problem and area affected on the body. Please use your medication only  as directed by your physician to prevent side effects.     Gentle Skin Care Guide  1. Bathe no more than once a day.  2. Avoid bathing in hot water  3. Use a mild soap like Dove, Vanicream, Cetaphil, CeraVe. Can use Lever 2000 or Cetaphil antibacterial soap  4. Use soap only where you need it. On most days, use it under your arms, between your legs, and on your feet. Let the water rinse other areas unless visibly dirty.  5. When you get out of the bath/shower, use a towel to gently blot your skin dry, don't rub it.  6. While your skin is still a little damp, apply a moisturizing cream such as Vanicream, CeraVe, Cetaphil, Eucerin, Sarna lotion or plain Vaseline Jelly. For hands apply Neutrogena [place] Hand Cream or Excipial Hand Cream.  7. Reapply moisturizer any time you start to itch or feel dry.  8. Sometimes using free and clear laundry detergents can be helpful. Fabric softener sheets should be avoided. Downy Free & Gentle liquid, or any liquid fabric softener that is free of dyes and perfumes, it acceptable to use  9. If your doctor has given you prescription creams you may apply moisturizers over them      Due to recent changes in healthcare laws, you may see results of your pathology and/or laboratory studies on MyChart before the doctors have had a chance to review them. We understand that in some cases there may be results that are confusing or concerning to you. Please  understand that not all results are received at the same time and often the doctors may need to interpret multiple results in order to provide you with the best plan of care or course of treatment. Therefore, we ask that you please give [place] 2 business days to thoroughly review all your results before contacting the office for clarification. Should we see a critical lab result, you will be contacted sooner.   If You Need Anything After Your Visit  If you have any questions or concerns for your doctor, please  call our main line at [phone] and press option 4 to reach your doctor's medical assistant. If no one answers, please leave a voicemail as directed and we will return your call as soon as possible. Messages left after 4 pm will be answered the following business day.   You may also send [place] a message via [place]. We typically respond to MyChart messages within 1-2 business days.  For prescription refills, please ask your pharmacy to contact our office. Our fax number is [phone].  If you have an urgent issue when the clinic is closed that cannot wait until the next business day, you can page your doctor at the number below.    Please note that while we do our best to be available for urgent issues outside of office hours, we are not available 24/7.   If you have an urgent issue and are unable to reach [place], you may choose to seek medical care at your doctor's office, retail clinic, urgent care center, or emergency room.  If you have a medical emergency, please immediately call 911 or go to the emergency department.  Pager Numbers  - Dr. Nehemiah Massed: [phone]  - Dr. Laurence Ferrari: [phone]  - Dr. Nicole Kindred: [phone]  In the event of inclement weather, please call our main line at [phone] for an update on the status of any delays or closures.  Dermatology Medication Tips: Please keep the boxes that topical medications come in in order to help keep track of the instructions about where and how to use these. Pharmacies typically print the medication instructions only on the boxes and not directly on the medication tubes.   If your medication is too expensive, please contact our office at [phone] option 4 or send [place] a message through [place].   We are unable to tell what your co-pay for medications will be in advance as this is different depending on your insurance coverage. However, we may be able to find a substitute medication at lower cost or fill out paperwork to get  insurance to cover a needed medication.   If a prior authorization is required to get your medication covered by your insurance company, please allow [place] 1-2 business days to complete this process.  Drug prices often vary depending on where the prescription is filled and some pharmacies may offer cheaper prices.  The website www.goodrx.com contains coupons for medications through different pharmacies. The prices here do not account for what the cost may be with help from insurance (it may be cheaper with your insurance), but the website can give you the price if you did not use any insurance.  - You can print the associated coupon and take it with your prescription to the pharmacy.  - You may also stop by our office during regular business hours and pick up a GoodRx coupon card.  - If you need your prescription sent electronically to a different pharmacy, notify our office through [facility] or  by phone at [phone] option 4.     Si Usted Necesita Algo Despus de Su Visita  Tambin puede enviarnos un mensaje a travs de Pharmacist, community. Por lo general respondemos a los mensajes de MyChart en el transcurso de 1 a 2 das hbiles.  Para renovar recetas, por favor pida a su farmacia que se ponga en contacto con nuestra oficina. Harland Dingwall de fax es [place] [phone].  Si tiene un asunto urgente cuando la clnica est cerrada y que no puede esperar hasta el siguiente da hbil, puede llamar/localizar a su doctor(a) al nmero que aparece a continuacin.   Por favor, tenga en cuenta que aunque hacemos todo lo posible para estar disponibles para asuntos urgentes fuera del horario de [place], no estamos disponibles las 24 horas del da, los 7 das de la [place].   Si tiene un problema urgente y no puede comunicarse con nosotros, puede optar por buscar atencin mdica  en el consultorio de su doctor(a), en una clnica privada, en un centro de atencin urgente o en una sala de emergencias.  Si  tiene Engineering geologist, por favor llame inmediatamente al 911 o vaya a la sala de emergencias.  Nmeros de bper  - Dr. Nehemiah Massed: [phone]  - Dra. Moye: [phone]  - Dra. Nicole Kindred: [phone]  En caso de inclemencias del [place], por favor llame a Johnsie Kindred principal al [phone] para una actualizacin sobre el [place] de cualquier retraso o cierre.  Consejos para la medicacin en dermatologa: Por favor, guarde las cajas en las que vienen los medicamentos de uso tpico para ayudarle a seguir las instrucciones sobre dnde y cmo usarlos. Las farmacias generalmente imprimen las instrucciones del medicamento slo en las cajas y no directamente en los tubos del [place].   Si su medicamento es muy caro, por favor, pngase en contacto con Zigmund Daniel llamando al [phone] y presione la opcin 4 o envenos un mensaje a travs de Pharmacist, community.   No podemos decirle cul ser su copago por los medicamentos por adelantado ya que esto es diferente dependiendo de la cobertura de su seguro. Sin embargo, es posible que podamos encontrar un medicamento sustituto a Electrical engineer un formulario para que el seguro cubra el medicamento que se considera necesario.   Si se requiere una autorizacin previa para que su compaa de seguros [place] su medicamento, por favor permtanos de 1 a 2 das hbiles para completar este proceso.  Los precios de los medicamentos varan con frecuencia dependiendo del Environmental consultant de dnde se surte la receta y alguna farmacias pueden ofrecer precios ms baratos.  El sitio web www.goodrx.com tiene cupones para medicamentos de Airline pilot. Los precios aqu no tienen en cuenta lo que podra costar con la ayuda del seguro (puede ser ms barato con su seguro), pero el sitio web puede darle el precio si no utiliz Research scientist (physical sciences).  - Puede imprimir el cupn correspondiente y llevarlo con su receta a la farmacia.  - Tambin puede pasar por nuestra oficina  durante el horario de atencin regular y Charity fundraiser una tarjeta de cupones de GoodRx.  - Si necesita que su receta se enve electrnicamente a una farmacia diferente, informe a nuestra oficina a travs de MyChart de [place] o por telfono llamando al [phone] y presione la opcin 4.

## 2022-09-19 LAB — LIPID PANEL
Chol/HDL Ratio: 3.1 ratio (ref 0.0–4.4)
Cholesterol, Total: 176 mg/dL (ref 100–199)
HDL: 57 mg/dL (ref >39)
LDL Chol Calc (NIH): 111 mg/dL — ABNORMAL HIGH (ref 0–99)
Triglycerides: 41 mg/dL (ref 0–149)
VLDL Cholesterol Cal: 8 mg/dL (ref 5–40)

## 2022-09-19 LAB — HEPATIC FUNCTION PANEL
ALT: 11 IU/L (ref 0–32)
AST: 15 IU/L (ref 0–40)
Albumin: 4.3 g/dL (ref 3.8–4.8)
Alkaline Phosphatase: 110 IU/L (ref 44–121)
Bilirubin Total: 0.5 mg/dL (ref 0.0–1.2)
Bilirubin, Direct: 0.16 mg/dL (ref 0.00–0.40)
Total Protein: 7.1 g/dL (ref 6.0–8.5)

## 2022-09-22 ENCOUNTER — Telehealth: Payer: Self-pay

## 2022-09-22 ENCOUNTER — Encounter: Payer: Self-pay | Admitting: Dermatology

## 2022-09-22 NOTE — Telephone Encounter (Signed)
-----   Message from Alfonso Patten, MD sent at 09/22/2022  2:26 PM EDT ----- 1. Skin , left thigh anterior SEBORRHEIC KERATOSIS, IRRITATED -->  This is a benign growth or "wisdom spot". No additional treatment is needed.    2. Skin , right lateral neck EPIDERMOID CYST "benign cyst" no treatment needed  MAs please call. Thank you!

## 2022-09-22 NOTE — Telephone Encounter (Signed)
Called to discuss pathology results and lab results. N/A. LMOVM to C/B.

## 2022-10-06 ENCOUNTER — Telehealth: Payer: Self-pay

## 2022-10-06 NOTE — Telephone Encounter (Signed)
-----   Message from Alfonso Patten, MD sent at 09/22/2022  2:26 PM EDT ----- 1. Skin , left thigh anterior SEBORRHEIC KERATOSIS, IRRITATED -->  This is a benign growth or "wisdom spot". No additional treatment is needed.    2. Skin , right lateral neck EPIDERMOID CYST "benign cyst" no treatment needed  MAs please call. Thank you!

## 2022-10-06 NOTE — Telephone Encounter (Addendum)
Patient came into office today for results. Patient states she was unable to reach [place] regarding results and had been out of town for past few days. Give patient results today and assured patient these are benign. Patient verbalized understanding and denied further questions.   ----- Message from Alfonso Patten, MD sent at 09/22/2022  2:26 PM EDT ----- 1. Skin , left thigh anterior SEBORRHEIC KERATOSIS, IRRITATED -->  This is a benign growth or "wisdom spot". No additional treatment is needed.    2. Skin , right lateral neck EPIDERMOID CYST "benign cyst" no treatment needed  MAs please call. Thank you!

## 2022-10-06 NOTE — Telephone Encounter (Signed)
Patient returned [place] call 10/05/22. Returned call today and left VM for patient to return our call or we could discuss with her at appt on 10/08/22.  Patient has labs and biopsy results!

## 2022-10-08 ENCOUNTER — Other Ambulatory Visit: Payer: Self-pay

## 2022-10-08 ENCOUNTER — Telehealth: Payer: Self-pay

## 2022-10-08 ENCOUNTER — Encounter: Payer: Self-pay | Admitting: Dermatology

## 2022-10-08 ENCOUNTER — Ambulatory Visit (INDEPENDENT_AMBULATORY_CARE_PROVIDER_SITE_OTHER): Payer: Medicare Other | Admitting: Dermatology

## 2022-10-08 DIAGNOSIS — L249 Irritant contact dermatitis, unspecified cause: Secondary | ICD-10-CM

## 2022-10-08 DIAGNOSIS — D2372 Other benign neoplasm of skin of left lower limb, including hip: Secondary | ICD-10-CM | POA: Diagnosis not present

## 2022-10-08 DIAGNOSIS — L719 Rosacea, unspecified: Secondary | ICD-10-CM

## 2022-10-08 MED ORDER — ISOTRETINOIN 20 MG PO CAPS: 20.0000 mg | ORAL_CAPSULE | Freq: Every day | ORAL | 0 refills | Status: DC

## 2022-10-08 MED ORDER — ISOTRETINOIN 20 MG PO CAPS: 20.0000 mg | ORAL_CAPSULE | Freq: Every day | ORAL | 0 refills | Status: AC

## 2022-10-08 NOTE — Patient Instructions (Addendum)
Hand/wrist: Use Clobetasol ointment here twice daily as needed.  Cryotherapy Aftercare  Wash gently with soap and water everyday.   Apply Vaseline daily until healed.   Stop taking Doxycycline 5 days prior to taking new medication (isotretinoin).  Do NOT take Doxycycline and Isotretinoin together.   Start Isotretinoin '20mg'$  1 capsule once daily with fatty meal.   Reviewed potential side effects of isotretinoin including xerosis, cheilitis, hepatitis, hyperlipidemia, and severe birth defects if taken by a pregnant woman. Reviewed reports of suicidal ideation in those with a history of depression while taking isotretinoin and reports of diagnosis of inflammatory bowl disease while taking isotretinoin as well as the lack of evidence for a causal relationship between isotretinoin, depression and IBD. Patient advised to reach out with any questions or concerns. Patient advised not to share pills or donate blood while on treatment or for one month after completing treatment.    Due to recent changes in healthcare laws, you may see results of your pathology and/or laboratory studies on MyChart before the doctors have had a chance to review them. We understand that in some cases there may be results that are confusing or concerning to you. Please understand that not all results are received at the same time and often the doctors may need to interpret multiple results in order to provide you with the best plan of care or course of treatment. Therefore, we ask that you please give [place] 2 business days to thoroughly review all your results before contacting the office for clarification. Should we see a critical lab result, you will be contacted sooner.   If You Need Anything After Your Visit  If you have any questions or concerns for your doctor, please call our main line at [phone] and press option 4 to reach your doctor's medical assistant. If no one answers, please leave a voicemail as directed and  we will return your call as soon as possible. Messages left after 4 pm will be answered the following business day.   You may also send [place] a message via [place]. We typically respond to MyChart messages within 1-2 business days.  For prescription refills, please ask your pharmacy to contact our office. Our fax number is [phone].  If you have an urgent issue when the clinic is closed that cannot wait until the next business day, you can page your doctor at the number below.    Please note that while we do our best to be available for urgent issues outside of office hours, we are not available 24/7.   If you have an urgent issue and are unable to reach [place], you may choose to seek medical care at your doctor's office, retail clinic, urgent care center, or emergency room.  If you have a medical emergency, please immediately call 911 or go to the emergency department.  Pager Numbers  - Dr. Nehemiah Massed: [phone]  - Dr. Laurence Ferrari: [phone]  - Dr. Nicole Kindred: [phone]  In the event of inclement weather, please call our main line at [phone] for an update on the status of any delays or closures.  Dermatology Medication Tips: Please keep the boxes that topical medications come in in order to help keep track of the instructions about where and how to use these. Pharmacies typically print the medication instructions only on the boxes and not directly on the medication tubes.   If your medication is too expensive, please contact our office at [phone] option 4 or send [place] a message through [place].  We are unable to tell what your co-pay for medications will be in advance as this is different depending on your insurance coverage. However, we may be able to find a substitute medication at lower cost or fill out paperwork to get insurance to cover a needed medication.   If a prior authorization is required to get your medication covered by your insurance company, please allow [place] 1-2  business days to complete this process.  Drug prices often vary depending on where the prescription is filled and some pharmacies may offer cheaper prices.  The website www.goodrx.com contains coupons for medications through different pharmacies. The prices here do not account for what the cost may be with help from insurance (it may be cheaper with your insurance), but the website can give you the price if you did not use any insurance.  - You can print the associated coupon and take it with your prescription to the pharmacy.  - You may also stop by our office during regular business hours and pick up a GoodRx coupon card.  - If you need your prescription sent electronically to a different pharmacy, notify our office through [facility] or by phone at [phone] option 4.     Si Usted Necesita Algo Despus de Su Visita  Tambin puede enviarnos un mensaje a travs de Pharmacist, community. Por lo general respondemos a los mensajes de MyChart en el transcurso de 1 a 2 das hbiles.  Para renovar recetas, por favor pida a su farmacia que se ponga en contacto con nuestra oficina. Harland Dingwall de fax es [place] [phone].  Si tiene un asunto urgente cuando la clnica est cerrada y que no puede esperar hasta el siguiente da hbil, puede llamar/localizar a su doctor(a) al nmero que aparece a continuacin.   Por favor, tenga en cuenta que aunque hacemos todo lo posible para estar disponibles para asuntos urgentes fuera del horario de [place], no estamos disponibles las 24 horas del da, los 7 das de la [place].   Si tiene un problema urgente y no puede comunicarse con nosotros, puede optar por buscar atencin mdica  en el consultorio de su doctor(a), en una clnica privada, en un centro de atencin urgente o en una sala de emergencias.  Si tiene Engineering geologist, por favor llame inmediatamente al 911 o vaya a la sala de emergencias.  Nmeros de bper  - Dr. Nehemiah Massed: [phone]  - Dra.  Moye: [phone]  - Dra. Nicole Kindred: [phone]  En caso de inclemencias del [place], por favor llame a Johnsie Kindred principal al [phone] para una actualizacin sobre el [place] de cualquier retraso o cierre.  Consejos para la medicacin en dermatologa: Por favor, guarde las cajas en las que vienen los medicamentos de uso tpico para ayudarle a seguir las instrucciones sobre dnde y cmo usarlos. Las farmacias generalmente imprimen las instrucciones del medicamento slo en las cajas y no directamente en los tubos del [place].   Si su medicamento es muy caro, por favor, pngase en contacto con Zigmund Daniel llamando al [phone] y presione la opcin 4 o envenos un mensaje a travs de Pharmacist, community.   No podemos decirle cul ser su copago por los medicamentos por adelantado ya que esto es diferente dependiendo de la cobertura de su seguro. Sin embargo, es posible que podamos encontrar un medicamento sustituto a Electrical engineer un formulario para que el seguro cubra el medicamento que se considera necesario.   Si se requiere una autorizacin previa para que su  compaa de seguros [place] su medicamento, por favor permtanos de 1 a 2 das hbiles para completar este proceso.  Los precios de los medicamentos varan con frecuencia dependiendo del Environmental consultant de dnde se surte la receta y alguna farmacias pueden ofrecer precios ms baratos.  El sitio web www.goodrx.com tiene cupones para medicamentos de Airline pilot. Los precios aqu no tienen en cuenta lo que podra costar con la ayuda del seguro (puede ser ms barato con su seguro), pero el sitio web puede darle el precio si no utiliz Research scientist (physical sciences).  - Puede imprimir el cupn correspondiente y llevarlo con su receta a la farmacia.  - Tambin puede pasar por nuestra oficina durante el horario de atencin regular y Charity fundraiser una tarjeta de cupones de GoodRx.  - Si necesita que su receta se enve electrnicamente a una farmacia diferente,  informe a nuestra oficina a travs de MyChart de [place] o por telfono llamando al [phone] y presione la opcin 4.

## 2022-10-08 NOTE — Telephone Encounter (Signed)
Patient was seen in office today. Discussed labs and medications. Isotretinoin sent in. Patient registered and confirmed in [place].

## 2022-10-08 NOTE — Progress Notes (Signed)
RX resent with ipledge ID attached. aw

## 2022-10-08 NOTE — Telephone Encounter (Signed)
-----   Message from Alfonso Patten, MD sent at 09/19/2022  3:10 PM EDT ----- Labs ok. Start isotretinoin.   MAs please call. Thank you!

## 2022-10-08 NOTE — Progress Notes (Signed)
Follow-Up Visit   Subjective  Betty Reynolds Auth is a 76 y.o. female who presents for the following: Rash (Recheck rash. Remove suture from neck, hx of EIC. ) and Rosacea (Discuss starting Isotretinoin today. Is still taking Doxycycline '20mg'$  twice daily).  The following portions of the chart were reviewed this encounter and updated as appropriate:  Tobacco  Allergies  Meds  Problems  Med Hx  Surg Hx  Fam Hx      Review of Systems: No other skin or systemic complaints except as noted in HPI or Assessment and Plan.   Objective  Well appearing patient in no apparent distress; mood and affect are within normal limits.  A focused examination was performed including face, neck, arms. Relevant physical exam findings are noted in the Assessment and Plan.  left medial leg below knee Firm pink/brown papulenodule with dimple sign.   Right Hand - Anterior Scaly erythematous papules   face Mid face erythema with telangiectasias +/- scattered inflammatory papules, with rhinophyma    Assessment & Plan   Encounter for Removal of Sutures - Incision site at the right neck is clean, dry and intact - Wound cleansed, sutures removed, wound cleansed  - Discussed pathology results showing epidermal cyst  - Scars remodel for a full year. - Once steri-strips fall off, patient can apply over-the-counter silicone scar cream each night to help with scar remodeling if desired. - Patient advised to call with any concerns or if they notice any new or changing lesions.   Benign neoplasm of skin of lower limb, including hip, left left medial leg below knee  Symptomatic per patient. Inflamed on exam. Consistent with irritated dermatofibroma.   Destruction of lesion - left medial leg below knee  Destruction method: cryotherapy   Informed consent: discussed and consent obtained   Lesion destroyed using liquid nitrogen: Yes   Region frozen until ice ball extended beyond lesion: Yes   Outcome:  patient tolerated procedure well with no complications   Post-procedure details: wound care instructions given   Additional details:  Prior to procedure, discussed risks of blister formation, small wound, skin dyspigmentation, or rare scar following cryotherapy. Recommend Vaseline ointment to treated areas while healing.   Irritant contact dermatitis, unspecified trigger Right Hand - Anterior  Use Clobetasol ointment here twice daily as needed. Avoid applying to face, groin, and axilla. Use as directed. Long-term use can cause thinning of the skin.  Topical steroids (such as triamcinolone, fluocinolone, fluocinonide, mometasone, clobetasol, halobetasol, betamethasone, hydrocortisone) can cause thinning and lightening of the skin if they are used for too long in the same area. Your physician has selected the right strength medicine for your problem and area affected on the body. Please use your medication only as directed by your physician to prevent side effects.    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 Condition is bothersome/symptomatic for patient. Currently flared.  Rosacea is a chronic progressive skin condition usually affecting the face of adults, causing redness and/or acne bumps. It is treatable but not curable. It sometimes affects the eyes (ocular rosacea) as well. It may respond to topical and/or systemic medication and can flare with stress, sun exposure, alcohol, exercise, topical steroids (including hydrocortisone/cortisone 10) and some foods.  Daily application of broad spectrum spf 30+ sunscreen to face is recommended to reduce flares.  Finish Doxycycline as directed. Wait 5 days before starting Isotretion.  Start Isotretinoin '20mg'$  1 capsule once daily  with fatty meal.   Reviewed potential side effects of isotretinoin including xerosis, cheilitis, hepatitis, hyperlipidemia, and severe birth defects if  taken by a pregnant woman. Reviewed reports of suicidal ideation in those with a history of depression while taking isotretinoin and reports of diagnosis of inflammatory bowl disease while taking isotretinoin as well as the lack of evidence for a causal relationship between isotretinoin, depression and IBD. Patient advised to reach out with any questions or concerns. Patient advised not to share pills or donate blood while on treatment or for one month after completing treatment.   Patient confirmed in iPledge and isotretinoin sent to pharmacy.   ISOtretinoin (ACCUTANE) 20 MG capsule - face Take 1 capsule (20 mg total) by mouth daily. Take with fatty meal     Return for Isotretinoin in 7 weeks.  I, Emelia Salisbury, CMA, am acting as scribe for Forest Gleason, MD.  Documentation: I have reviewed the above documentation for accuracy and completeness, and I agree with the above.  Forest Gleason, MD

## 2022-10-09 ENCOUNTER — Encounter: Payer: Self-pay | Admitting: Dermatology

## 2022-10-12 ENCOUNTER — Ambulatory Visit (INDEPENDENT_AMBULATORY_CARE_PROVIDER_SITE_OTHER): Payer: Medicare Other | Admitting: Dermatology

## 2022-10-12 ENCOUNTER — Telehealth: Payer: Self-pay

## 2022-10-12 DIAGNOSIS — T148XXA Other injury of unspecified body region, initial encounter: Secondary | ICD-10-CM

## 2022-10-12 DIAGNOSIS — W57XXXA Bitten or stung by nonvenomous insect and other nonvenomous arthropods, initial encounter: Secondary | ICD-10-CM | POA: Diagnosis not present

## 2022-10-12 MED ORDER — DOXYCYCLINE MONOHYDRATE 100 MG PO CAPS: 100.0000 mg | ORAL_CAPSULE | Freq: Two times a day (BID) | ORAL | 0 refills | Status: DC

## 2022-10-12 MED ORDER — CLOBETASOL PROPIONATE 0.05 % EX CREA: 1.0000 | TOPICAL_CREAM | Freq: Two times a day (BID) | CUTANEOUS | 0 refills | Status: DC

## 2022-10-12 NOTE — Progress Notes (Signed)
   Follow-Up Visit   Subjective  Betty Reynolds is a 76 y.o. female who presents for the following: Skin Problem. Patient here today for red bumps at face, back and chest. The bumps at chest and back itch.  The following portions of the chart were reviewed this encounter and updated as appropriate:   Tobacco  Allergies  Meds  Problems  Med Hx  Surg Hx  Fam Hx     Review of Systems:  No other skin or systemic complaints except as noted in HPI or Assessment and Plan.  Objective  Well appearing patient in no apparent distress; mood and affect are within normal limits.  A focused examination was performed including face, neck, chest and back and  . Relevant physical exam findings are noted in the Assessment and Plan.   Assessment & Plan  Insect bite of multiple sites with local reaction chest, back  Possible tick bite reaction  Start doxycycline 100 mg twice daily with food for 1 week.  Start clobetasol 0.05% cream twice daily to affected areas as needed for itch. Avoid applying to face, groin, and axilla. Use as directed. Long-term use can cause thinning of the skin.  Topical steroids (such as triamcinolone, fluocinolone, fluocinonide, mometasone, clobetasol, halobetasol, betamethasone, hydrocortisone) can cause thinning and lightening of the skin if they are used for too long in the same area. Your physician has selected the right strength medicine for your problem and area affected on the body. Please use your medication only as directed by your physician to prevent side effects.   Doxycycline should be taken with food to prevent nausea. Do not lay down for 30 minutes after taking. Be cautious with sun exposure and use good sun protection while on this medication. Pregnant women should not take this medication.    clobetasol cream (TEMOVATE) 0.05 % - chest, back Apply 1 Application topically 2 (two) times daily. Avoid applying to face, groin, and axilla. Use as directed.  Long-term use can cause thinning of the skin.  doxycycline (MONODOX) 100 MG capsule - chest, back Take 1 capsule (100 mg total) by mouth 2 (two) times daily. Take with food   Return for as scheduled.  Graciella Belton, RMA, am acting as scribe for Sarina Ser, MD . Documentation: I have reviewed the above documentation for accuracy and completeness, and I agree with the above.  Sarina Ser, MD

## 2022-10-12 NOTE — Telephone Encounter (Signed)
Please send to [facility] or [facility], whichever has the lower price for their generic. Thank you!

## 2022-10-12 NOTE — Telephone Encounter (Signed)
Spoke with patient who wants to talk to [place] first & then call our office back. aw

## 2022-10-12 NOTE — Patient Instructions (Addendum)
Start doxycycline 100 mg twice daily with food for 1 week.  Start clobetasol 0.05% cream twice daily to affected areas as needed for itch. Avoid applying to face, groin, and axilla. Use as directed. Long-term use can cause thinning of the skin.  Topical steroids (such as triamcinolone, fluocinolone, fluocinonide, mometasone, clobetasol, halobetasol, betamethasone, hydrocortisone) can cause thinning and lightening of the skin if they are used for too long in the same area. Your physician has selected the right strength medicine for your problem and area affected on the body. Please use your medication only as directed by your physician to prevent side effects.   Doxycycline should be taken with food to prevent nausea. Do not lay down for 30 minutes after taking. Be cautious with sun exposure and use good sun protection while on this medication. Pregnant women should not take this medication.   Due to recent changes in healthcare laws, you may see results of your pathology and/or laboratory studies on MyChart before the doctors have had a chance to review them. We understand that in some cases there may be results that are confusing or concerning to you. Please understand that not all results are received at the same time and often the doctors may need to interpret multiple results in order to provide you with the best plan of care or course of treatment. Therefore, we ask that you please give [place] 2 business days to thoroughly review all your results before contacting the office for clarification. Should we see a critical lab result, you will be contacted sooner.   If You Need Anything After Your Visit  If you have any questions or concerns for your doctor, please call our main line at [phone] and press option 4 to reach your doctor's medical assistant. If no one answers, please leave a voicemail as directed and we will return your call as soon as possible. Messages left after 4 pm will be answered the  following business day.   You may also send [place] a message via [place]. We typically respond to MyChart messages within 1-2 business days.  For prescription refills, please ask your pharmacy to contact our office. Our fax number is [phone].  If you have an urgent issue when the clinic is closed that cannot wait until the next business day, you can page your doctor at the number below.    Please note that while we do our best to be available for urgent issues outside of office hours, we are not available 24/7.   If you have an urgent issue and are unable to reach [place], you may choose to seek medical care at your doctor's office, retail clinic, urgent care center, or emergency room.  If you have a medical emergency, please immediately call 911 or go to the emergency department.  Pager Numbers  - Dr. Nehemiah Massed: [phone]  - Dr. Laurence Ferrari: [phone]  - Dr. Nicole Kindred: [phone]  In the event of inclement weather, please call our main line at [phone] for an update on the status of any delays or closures.  Dermatology Medication Tips: Please keep the boxes that topical medications come in in order to help keep track of the instructions about where and how to use these. Pharmacies typically print the medication instructions only on the boxes and not directly on the medication tubes.   If your medication is too expensive, please contact our office at [phone] option 4 or send [place] a message through [place].   We are unable to tell what  We are unable to tell what your co-pay for medications will be in advance as this is different depending on your insurance coverage. However, we may be able to find a substitute medication at lower cost or fill out paperwork to get insurance to cover a needed medication.   If a prior authorization is required to get your medication covered by your insurance company, please allow us 1-2 business days to complete this process.  Drug prices often vary depending on  where the prescription is filled and some pharmacies may offer cheaper prices.  The website www.goodrx.com contains coupons for medications through different pharmacies. The prices here do not account for what the cost may be with help from insurance (it may be cheaper with your insurance), but the website can give you the price if you did not use any insurance.  - You can print the associated coupon and take it with your prescription to the pharmacy.  - You may also stop by our office during regular business hours and pick up a GoodRx coupon card.  - If you need your prescription sent electronically to a different pharmacy, notify our office through [place] MyChart or by phone at [phone] option 4.     Si Usted Necesita Algo Despus de Su Visita  Tambin puede enviarnos un mensaje a travs de MyChart. Por lo general respondemos a los mensajes de MyChart en el transcurso de 1 a 2 das hbiles.  Para renovar recetas, por favor pida a su farmacia que se ponga en contacto con nuestra oficina. Nuestro nmero de fax es el [phone].  Si tiene un asunto urgente cuando la clnica est cerrada y que no puede esperar hasta el siguiente da hbil, puede llamar/localizar a su doctor(a) al nmero que aparece a continuacin.   Por favor, tenga en cuenta que aunque hacemos todo lo posible para estar disponibles para asuntos urgentes fuera del horario de oficina, no estamos disponibles las 24 horas del da, los 7 das de la semana.   Si tiene un problema urgente y no puede comunicarse con nosotros, puede optar por buscar atencin mdica  en el consultorio de su doctor(a), en una clnica privada, en un centro de atencin urgente o en una sala de emergencias.  Si tiene una emergencia mdica, por favor llame inmediatamente al 911 o vaya a la sala de emergencias.  Nmeros de bper  - Dr. Kowalski: [phone]  - Dra. Moye: [phone]  - Dra. Stewart: [phone]  En caso de inclemencias  del tiempo, por favor llame a nuestra lnea principal al [phone] para una actualizacin sobre el estado de cualquier retraso o cierre.  Consejos para la medicacin en dermatologa: Por favor, guarde las cajas en las que vienen los medicamentos de uso tpico para ayudarle a seguir las instrucciones sobre dnde y cmo usarlos. Las farmacias generalmente imprimen las instrucciones del medicamento slo en las cajas y no directamente en los tubos del medicamento.   Si su medicamento es muy caro, por favor, pngase en contacto con nuestra oficina llamando al [phone] y presione la opcin 4 o envenos un mensaje a travs de MyChart.   No podemos decirle cul ser su copago por los medicamentos por adelantado ya que esto es diferente dependiendo de la cobertura de su seguro. Sin embargo, es posible que podamos encontrar un medicamento sustituto a menor costo o llenar un formulario para que el seguro cubra el medicamento que se considera necesario.   Si se requiere una autorizacin previa para que su   por favor permtanos de 1 a 2 das hbiles para completar este proceso.  Los precios de los medicamentos varan con frecuencia dependiendo del Environmental consultant de dnde se surte la receta y alguna farmacias pueden ofrecer precios ms baratos.  El sitio web www.goodrx.com tiene cupones para medicamentos de Airline pilot. Los precios aqu no tienen en cuenta lo que podra costar con la ayuda del seguro (puede ser ms barato con su seguro), pero el sitio web puede darle el precio si no utiliz Research scientist (physical sciences).  - Puede imprimir el cupn correspondiente y llevarlo con su receta a la farmacia.  - Tambin puede pasar por nuestra oficina durante el horario de atencin regular y Charity fundraiser una tarjeta de cupones de GoodRx.  - Si necesita que su receta se enve electrnicamente a una farmacia diferente, informe a nuestra oficina a travs de MyChart de [place] o por telfono llamando al  [phone] y presione la opcin 4.

## 2022-10-12 NOTE — Telephone Encounter (Signed)
Patient left nurse VM regarding accutane RX. Isotretinoin is $400 at pharmacy, patient unable to afford.   The cheapest GoodRX has RX for is $88 with [facility].

## 2022-10-14 ENCOUNTER — Ambulatory Visit (INDEPENDENT_AMBULATORY_CARE_PROVIDER_SITE_OTHER): Payer: Medicare Other | Admitting: Dermatology

## 2022-10-14 DIAGNOSIS — R21 Rash and other nonspecific skin eruption: Secondary | ICD-10-CM | POA: Diagnosis not present

## 2022-10-14 DIAGNOSIS — S20462A Insect bite (nonvenomous) of left back wall of thorax, initial encounter: Secondary | ICD-10-CM

## 2022-10-14 DIAGNOSIS — W57XXXA Bitten or stung by nonvenomous insect and other nonvenomous arthropods, initial encounter: Secondary | ICD-10-CM

## 2022-10-14 DIAGNOSIS — L237 Allergic contact dermatitis due to plants, except food: Secondary | ICD-10-CM

## 2022-10-14 DIAGNOSIS — S30861A Insect bite (nonvenomous) of abdominal wall, initial encounter: Secondary | ICD-10-CM

## 2022-10-14 DIAGNOSIS — S30860A Insect bite (nonvenomous) of lower back and pelvis, initial encounter: Secondary | ICD-10-CM

## 2022-10-14 DIAGNOSIS — L821 Other seborrheic keratosis: Secondary | ICD-10-CM

## 2022-10-14 MED ORDER — PREDNISONE 20 MG PO TABS: ORAL_TABLET | ORAL | 0 refills | Status: DC

## 2022-10-14 NOTE — Progress Notes (Signed)
   Follow-Up Visit   Subjective  Betty Reynolds is a 76 y.o. female who presents for the following: Rash (On the neck and scalp started yesterday after taking first Doxycycline '100mg'$  po. She is using Clobetasol which helped some but she continues to itch. Patient is concerned that she is having an allergic reaction and would like to be checked today).  The following portions of the chart were reviewed this encounter and updated as appropriate:   Tobacco  Allergies  Meds  Problems  Med Hx  Surg Hx  Fam Hx     Review of Systems:  No other skin or systemic complaints except as noted in HPI or Assessment and Plan.  Objective  Well appearing patient in no apparent distress; mood and affect are within normal limits.  A focused examination was performed including the face, neck, and scalp. Relevant physical exam findings are noted in the Assessment and Plan.  Neck Erythematous papules.       L mid back, R abdomen, R sacral area Erythematous papules.         Assessment & Plan  Rash -most consistent with contact dermatitis likely to plant Neck  Likely Poison ivy - not consistent with bite reaction or allergic reaction  Continue Clobetasol 0.05% ointment to aa's QD-BID PRN. Topical steroids (such as triamcinolone, fluocinolone, fluocinonide, mometasone, clobetasol, halobetasol, betamethasone, hydrocortisone) can cause thinning and lightening of the skin if they are used for too long in the same area. Your physician has selected the right strength medicine for your problem and area affected on the body. Please use your medication only as directed by your physician to prevent side effects.   Stop Doxycycline since patient is concerned it may be the cause of the rash.  Start Prednisone taper  predniSONE (DELTASONE) 20 MG tablet - Neck Take 2 tabs po QD x 3 days, then 1 tab po QD x 3 days, then 1 tab po QOD.  Related Medications clobetasol ointment (TEMOVATE) 0.05 % Apply  twice daily to arms up to 2 weeks as needed for rash/itching. Avoid applying to face, groin, and axilla.  hydrocortisone 2.5 % cream Apply twice daily to forehead 1-2 weeks as needed for rash/itching.  Insect bite of multiple sites with local reaction L mid back, R abdomen, R sacral area  Consistent with bite reaction -   Continue Mupirocin 2% ointment to aa's BID until healed.  D/C Doxycycline since patient is concerned she is allergic to medication.   Related Medications clobetasol cream (TEMOVATE) 1.61 % Apply 1 Application topically 2 (two) times daily. Avoid applying to face, groin, and axilla. Use as directed. Long-term use can cause thinning of the skin.  doxycycline (MONODOX) 100 MG capsule Take 1 capsule (100 mg total) by mouth 2 (two) times daily. Take with food  Seborrheic Keratoses  - Stuck-on, waxy, tan-brown papules and/or plaques  - Benign-appearing - Discussed benign etiology and prognosis. - Observe - Call for any changes  Return in about 2 weeks (around 10/28/2022).  Luther Redo, CMA, am acting as scribe for Sarina Ser, MD . Documentation: I have reviewed the above documentation for accuracy and completeness, and I agree with the above.  Sarina Ser, MD

## 2022-10-14 NOTE — Patient Instructions (Addendum)
Risks of prednisone taper discussed including mood irritability, insomnia, weight gain, stomach ulcers, increased risk of infection, increased blood sugar (diabetes), hypertension, osteoporosis with long-term or frequent use, and rare risk of avascular necrosis of the hip.    Due to recent changes in healthcare laws, you may see results of your pathology and/or laboratory studies on MyChart before the doctors have had a chance to review them. We understand that in some cases there may be results that are confusing or concerning to you. Please understand that not all results are received at the same time and often the doctors may need to interpret multiple results in order to provide you with the best plan of care or course of treatment. Therefore, we ask that you please give [place] 2 business days to thoroughly review all your results before contacting the office for clarification. Should we see a critical lab result, you will be contacted sooner.   If You Need Anything After Your Visit  If you have any questions or concerns for your doctor, please call our main line at [phone] and press option 4 to reach your doctor's medical assistant. If no one answers, please leave a voicemail as directed and we will return your call as soon as possible. Messages left after 4 pm will be answered the following business day.   You may also send [place] a message via [place]. We typically respond to MyChart messages within 1-2 business days.  For prescription refills, please ask your pharmacy to contact our office. Our fax number is [phone].  If you have an urgent issue when the clinic is closed that cannot wait until the next business day, you can page your doctor at the number below.    Please note that while we do our best to be available for urgent issues outside of office hours, we are not available 24/7.   If you have an urgent issue and are unable to reach [place], you may choose to seek medical care at your  doctor's office, retail clinic, urgent care center, or emergency room.  If you have a medical emergency, please immediately call 911 or go to the emergency department.  Pager Numbers  - Dr. Nehemiah Massed: [phone]  - Dr. Laurence Ferrari: [phone]  - Dr. Nicole Kindred: [phone]  In the event of inclement weather, please call our main line at [phone] for an update on the status of any delays or closures.  Dermatology Medication Tips: Please keep the boxes that topical medications come in in order to help keep track of the instructions about where and how to use these. Pharmacies typically print the medication instructions only on the boxes and not directly on the medication tubes.   If your medication is too expensive, please contact our office at [phone] option 4 or send [place] a message through [place].   We are unable to tell what your co-pay for medications will be in advance as this is different depending on your insurance coverage. However, we may be able to find a substitute medication at lower cost or fill out paperwork to get insurance to cover a needed medication.   If a prior authorization is required to get your medication covered by your insurance company, please allow [place] 1-2 business days to complete this process.  Drug prices often vary depending on where the prescription is filled and some pharmacies may offer cheaper prices.  The website www.goodrx.com contains coupons for medications through different pharmacies. The prices here do not account for what the cost  may be with help from insurance (it may be cheaper with your insurance), but the website can give you the price if you did not use any insurance.  - You can print the associated coupon and take it with your prescription to the pharmacy.  - You may also stop by our office during regular business hours and pick up a GoodRx coupon card.  - If you need your prescription sent electronically to a different pharmacy, notify  our office through [facility] or by phone at [phone] option 4.     Si Usted Necesita Algo Despus de Su Visita  Tambin puede enviarnos un mensaje a travs de Pharmacist, community. Por lo general respondemos a los mensajes de MyChart en el transcurso de 1 a 2 das hbiles.  Para renovar recetas, por favor pida a su farmacia que se ponga en contacto con nuestra oficina. Harland Dingwall de fax es [place] [phone].  Si tiene un asunto urgente cuando la clnica est cerrada y que no puede esperar hasta el siguiente da hbil, puede llamar/localizar a su doctor(a) al nmero que aparece a continuacin.   Por favor, tenga en cuenta que aunque hacemos todo lo posible para estar disponibles para asuntos urgentes fuera del horario de [place], no estamos disponibles las 24 horas del da, los 7 das de la [place].   Si tiene un problema urgente y no puede comunicarse con nosotros, puede optar por buscar atencin mdica  en el consultorio de su doctor(a), en una clnica privada, en un centro de atencin urgente o en una sala de emergencias.  Si tiene Engineering geologist, por favor llame inmediatamente al 911 o vaya a la sala de emergencias.  Nmeros de bper  - Dr. Nehemiah Massed: [phone]  - Dra. Moye: [phone]  - Dra. Nicole Kindred: [phone]  En caso de inclemencias del [place], por favor llame a Johnsie Kindred principal al [phone] para una actualizacin sobre el [place] de cualquier retraso o cierre.  Consejos para la medicacin en dermatologa: Por favor, guarde las cajas en las que vienen los medicamentos de uso tpico para ayudarle a seguir las instrucciones sobre dnde y cmo usarlos. Las farmacias generalmente imprimen las instrucciones del medicamento slo en las cajas y no directamente en los tubos del [place].   Si su medicamento es muy caro, por favor, pngase en contacto con Zigmund Daniel llamando al [phone] y presione la opcin 4 o envenos un mensaje a travs de  Pharmacist, community.   No podemos decirle cul ser su copago por los medicamentos por adelantado ya que esto es diferente dependiendo de la cobertura de su seguro. Sin embargo, es posible que podamos encontrar un medicamento sustituto a Electrical engineer un formulario para que el seguro cubra el medicamento que se considera necesario.   Si se requiere una autorizacin previa para que su compaa de seguros [place] su medicamento, por favor permtanos de 1 a 2 das hbiles para completar este proceso.  Los precios de los medicamentos varan con frecuencia dependiendo del Environmental consultant de dnde se surte la receta y alguna farmacias pueden ofrecer precios ms baratos.  El sitio web www.goodrx.com tiene cupones para medicamentos de Airline pilot. Los precios aqu no tienen en cuenta lo que podra costar con la ayuda del seguro (puede ser ms barato con su seguro), pero el sitio web puede darle el precio si no utiliz Research scientist (physical sciences).  - Puede imprimir el cupn correspondiente y llevarlo con su receta a la farmacia.  - Tambin puede pasar por Cleotis Nipper  oficina durante el horario de atencin regular y Charity fundraiser una tarjeta de cupones de GoodRx.  - Si necesita que su receta se enve electrnicamente a una farmacia diferente, informe a nuestra oficina a travs de MyChart de [place] o por telfono llamando al [phone] y presione la opcin 4.

## 2022-10-23 ENCOUNTER — Encounter: Payer: Self-pay | Admitting: Dermatology

## 2022-10-24 ENCOUNTER — Encounter: Payer: Self-pay | Admitting: Dermatology

## 2022-10-28 ENCOUNTER — Ambulatory Visit (INDEPENDENT_AMBULATORY_CARE_PROVIDER_SITE_OTHER): Payer: Medicare Other | Admitting: Dermatology

## 2022-10-28 DIAGNOSIS — R21 Rash and other nonspecific skin eruption: Secondary | ICD-10-CM

## 2022-10-28 DIAGNOSIS — L219 Seborrheic dermatitis, unspecified: Secondary | ICD-10-CM | POA: Diagnosis not present

## 2022-10-28 DIAGNOSIS — Z79899 Other long term (current) drug therapy: Secondary | ICD-10-CM | POA: Diagnosis not present

## 2022-10-28 DIAGNOSIS — L237 Allergic contact dermatitis due to plants, except food: Secondary | ICD-10-CM

## 2022-10-28 MED ORDER — MOMETASONE FUROATE 0.1 % EX CREA: 1.0000 | TOPICAL_CREAM | CUTANEOUS | 0 refills | Status: DC

## 2022-10-28 MED ORDER — KETOCONAZOLE 2 % EX SHAM: 1.0000 | MEDICATED_SHAMPOO | CUTANEOUS | 11 refills | Status: AC

## 2022-10-28 NOTE — Patient Instructions (Addendum)
Start Allegra '180mg'$  or Claritin daily for itching Cont Clobetasol cream to hands and neck until clear, do not use on face, under arms or in groin Start Mometasone cr once daily up to 5 days a week to rash on face until clear Start Ketoconazole 2% shampoo 2 times weekly to wash scalp, let sit 5 minutes and rinse out    Due to recent changes in healthcare laws, you may see results of your pathology and/or laboratory studies on MyChart before the doctors have had a chance to review them. We understand that in some cases there may be results that are confusing or concerning to you. Please understand that not all results are received at the same time and often the doctors may need to interpret multiple results in order to provide you with the best plan of care or course of treatment. Therefore, we ask that you please give [place] 2 business days to thoroughly review all your results before contacting the office for clarification. Should we see a critical lab result, you will be contacted sooner.   If You Need Anything After Your Visit  If you have any questions or concerns for your doctor, please call our main line at [phone] and press option 4 to reach your doctor's medical assistant. If no one answers, please leave a voicemail as directed and we will return your call as soon as possible. Messages left after 4 pm will be answered the following business day.   You may also send [place] a message via [place]. We typically respond to MyChart messages within 1-2 business days.  For prescription refills, please ask your pharmacy to contact our office. Our fax number is [phone].  If you have an urgent issue when the clinic is closed that cannot wait until the next business day, you can page your doctor at the number below.    Please note that while we do our best to be available for urgent issues outside of office hours, we are not available 24/7.   If you have an urgent issue and are unable to reach [place],  you may choose to seek medical care at your doctor's office, retail clinic, urgent care center, or emergency room.  If you have a medical emergency, please immediately call 911 or go to the emergency department.  Pager Numbers  - Dr. Nehemiah Massed: [phone]  - Dr. Laurence Ferrari: [phone]  - Dr. Nicole Kindred: [phone]  In the event of inclement weather, please call our main line at [phone] for an update on the status of any delays or closures.  Dermatology Medication Tips: Please keep the boxes that topical medications come in in order to help keep track of the instructions about where and how to use these. Pharmacies typically print the medication instructions only on the boxes and not directly on the medication tubes.   If your medication is too expensive, please contact our office at [phone] option 4 or send [place] a message through [place].   We are unable to tell what your co-pay for medications will be in advance as this is different depending on your insurance coverage. However, we may be able to find a substitute medication at lower cost or fill out paperwork to get insurance to cover a needed medication.   If a prior authorization is required to get your medication covered by your insurance company, please allow [place] 1-2 business days to complete this process.  Drug prices often vary depending on where the prescription is filled and some pharmacies may  offer cheaper prices.  The website www.goodrx.com contains coupons for medications through different pharmacies. The prices here do not account for what the cost may be with help from insurance (it may be cheaper with your insurance), but the website can give you the price if you did not use any insurance.  - You can print the associated coupon and take it with your prescription to the pharmacy.  - You may also stop by our office during regular business hours and pick up a GoodRx coupon card.  - If you need your prescription sent  electronically to a different pharmacy, notify our office through [facility] or by phone at [phone] option 4.     Si Usted Necesita Algo Despus de Su Visita  Tambin puede enviarnos un mensaje a travs de Pharmacist, community. Por lo general respondemos a los mensajes de MyChart en el transcurso de 1 a 2 das hbiles.  Para renovar recetas, por favor pida a su farmacia que se ponga en contacto con nuestra oficina. Harland Dingwall de fax es [place] [phone].  Si tiene un asunto urgente cuando la clnica est cerrada y que no puede esperar hasta el siguiente da hbil, puede llamar/localizar a su doctor(a) al nmero que aparece a continuacin.   Por favor, tenga en cuenta que aunque hacemos todo lo posible para estar disponibles para asuntos urgentes fuera del horario de [place], no estamos disponibles las 24 horas del da, los 7 das de la [place].   Si tiene un problema urgente y no puede comunicarse con nosotros, puede optar por buscar atencin mdica  en el consultorio de su doctor(a), en una clnica privada, en un centro de atencin urgente o en una sala de emergencias.  Si tiene Engineering geologist, por favor llame inmediatamente al 911 o vaya a la sala de emergencias.  Nmeros de bper  - Dr. Nehemiah Massed: [phone]  - Dra. Moye: [phone]  - Dra. Nicole Kindred: [phone]  En caso de inclemencias del [place], por favor llame a Johnsie Kindred principal al [phone] para una actualizacin sobre el [place] de cualquier retraso o cierre.  Consejos para la medicacin en dermatologa: Por favor, guarde las cajas en las que vienen los medicamentos de uso tpico para ayudarle a seguir las instrucciones sobre dnde y cmo usarlos. Las farmacias generalmente imprimen las instrucciones del medicamento slo en las cajas y no directamente en los tubos del [place].   Si su medicamento es muy caro, por favor, pngase en contacto con Zigmund Daniel llamando al [phone] y presione la  opcin 4 o envenos un mensaje a travs de Pharmacist, community.   No podemos decirle cul ser su copago por los medicamentos por adelantado ya que esto es diferente dependiendo de la cobertura de su seguro. Sin embargo, es posible que podamos encontrar un medicamento sustituto a Electrical engineer un formulario para que el seguro cubra el medicamento que se considera necesario.   Si se requiere una autorizacin previa para que su compaa de seguros [place] su medicamento, por favor permtanos de 1 a 2 das hbiles para completar este proceso.  Los precios de los medicamentos varan con frecuencia dependiendo del Environmental consultant de dnde se surte la receta y alguna farmacias pueden ofrecer precios ms baratos.  El sitio web www.goodrx.com tiene cupones para medicamentos de Airline pilot. Los precios aqu no tienen en cuenta lo que podra costar con la ayuda del seguro (puede ser ms barato con su seguro), pero el sitio web puede darle el precio si no Copy  ningn seguro.  - Puede imprimir el cupn correspondiente y llevarlo con su receta a la farmacia.  - Tambin puede pasar por nuestra oficina durante el horario de atencin regular y Charity fundraiser una tarjeta de cupones de GoodRx.  - Si necesita que su receta se enve electrnicamente a una farmacia diferente, informe a nuestra oficina a travs de MyChart de [place] o por telfono llamando al [phone] y presione la opcin 4.

## 2022-10-28 NOTE — Progress Notes (Signed)
   Follow-Up Visit   Subjective  Betty Reynolds is a 76 y.o. female who presents for the following: Rash most c/w Contact Derm likely to plant (Neck, scalp, face, 2 wk f/u, Clobetasol oint to neck, scalp, otc HC to face, Prednisone Taper finished, some new spots on face). The patient has spots, moles and lesions to be evaluated, some may be new or changing and the patient has concerns that these could be cancer.  The following portions of the chart were reviewed this encounter and updated as appropriate:   Tobacco  Allergies  Meds  Problems  Med Hx  Surg Hx  Fam Hx     Review of Systems:  No other skin or systemic complaints except as noted in HPI or Assessment and Plan.  Objective  Well appearing patient in no apparent distress; mood and affect are within normal limits.  A focused examination was performed including face, neck, scalp. Relevant physical exam findings are noted in the Assessment and Plan.  face, neck, scalp Few scattered paps face   Assessment & Plan  Rash face, neck, scalp Most consistent with Contact Dermatitis likely to plant With residual  Start Mometasone qd up to 5d/wk to aa face until clear Recommend wearing gloves when mowing, wash clothes and gloves when done mowing Start Allegra '180mg'$  or Claritin po qam Cont Singulair as directed Cont Clobetasol cr qd up to 5d/wk aa rash on neck  Seborrheic dermatitis Scalp Seborrheic Dermatitis  -  is a chronic persistent rash characterized by pinkness and scaling most commonly of the mid face but also can occur on the scalp (dandruff), ears; mid chest, mid back and groin.  It tends to be exacerbated by stress and cooler weather.  People who have neurologic disease may experience new onset or exacerbation of existing seborrheic dermatitis.  The condition is not curable but treatable and can be controlled.  Start Ketoconazole 2% shampoo 2-3d/wk, let sit 5 minutes and rinse out  ketoconazole (NIZORAL) 2 %  shampoo - Scalp Apply 1 Application topically 2 (two) times a week. Wash scalp 2 times weekly, let sit 5 minutes and rinse out  Return if symptoms worsen or fail to improve.  I, Othelia Pulling, RMA, am acting as scribe for Sarina Ser, MD . Documentation: I have reviewed the above documentation for accuracy and completeness, and I agree with the above.  Sarina Ser, MD

## 2022-11-12 ENCOUNTER — Encounter: Payer: Self-pay | Admitting: Dermatology

## 2022-12-01 ENCOUNTER — Ambulatory Visit (INDEPENDENT_AMBULATORY_CARE_PROVIDER_SITE_OTHER): Payer: Medicare Other | Admitting: Dermatology

## 2022-12-01 DIAGNOSIS — L700 Acne vulgaris: Secondary | ICD-10-CM | POA: Diagnosis not present

## 2022-12-01 DIAGNOSIS — L719 Rosacea, unspecified: Secondary | ICD-10-CM | POA: Diagnosis not present

## 2022-12-01 DIAGNOSIS — R21 Rash and other nonspecific skin eruption: Secondary | ICD-10-CM | POA: Diagnosis not present

## 2022-12-01 MED ORDER — CLINDAMYCIN PHOSPHATE 1 % EX SOLN: Freq: Two times a day (BID) | CUTANEOUS | 2 refills | Status: AC

## 2022-12-01 NOTE — Progress Notes (Unsigned)
   Follow-Up Visit   Subjective  Betty Reynolds is a 76 y.o. female who presents for the following: Rosacea (Patient never started isotretinoin. She is using metrocream 0.75% 1-2 times daily. ) and Rash (Patient periodically continues to get itching and some discoloration at forearms and fingers. She does use clobetasol usually once daily. ).  Patient advises she has had reactions to certain jewelry if not gold.   The following portions of the chart were reviewed this encounter and updated as appropriate:       Review of Systems:  No other skin or systemic complaints except as noted in HPI or Assessment and Plan.  Objective  Well appearing patient in no apparent distress; mood and affect are within normal limits.  A focused examination was performed including arms, hands, face. Relevant physical exam findings are noted in the Assessment and Plan.  arms, hands Postinflammatory hyperpigmentation   right cheek Inflammatory papule    Assessment & Plan  Rosacea face  Rash arms, hands  Recommend OTC [place] Bond Rapid Relief Anti-Itch cream (pramoxine + menthol), CeraVe Anti-itch cream or lotion (pramoxine), Sarna lotion (Original- menthol + camphor or Sensitive- pramoxine) or Eucerin 12 hour Itch Relief lotion (menthol) up to 3 times per day to areas on body that are itchy.  Discontinue clobetasol and only use 1-2 times daily for up to 2 weeks as needed for flares. Avoid applying to face, groin, and axilla. Use as directed. Long-term use can cause thinning of the skin.    Related Medications clobetasol ointment (TEMOVATE) 0.05 % Apply twice daily to arms up to 2 weeks as needed for rash/itching. Avoid applying to face, groin, and axilla.  Acne vulgaris right cheek  Start HC 2.5% daily to affected area at right cheek for a few days to calm down.  Then start small amount of [place] to affected area a few times weekly. Start clindamycin 1-2 times daily to affected areas at face.     Return in about 4 months (around 04/02/2023) for Rosacea, acne.  Graciella Belton, RMA, am acting as scribe for Forest Gleason, MD .

## 2022-12-01 NOTE — Patient Instructions (Addendum)
Recommend OTC Gold Bond Rapid Relief Anti-Itch cream (pramoxine + menthol), CeraVe Anti-itch cream or lotion (pramoxine), Sarna lotion (Original- menthol + camphor or Sensitive- pramoxine) or Eucerin 12 hour Itch Relief lotion (menthol) up to 3 times per day to areas on body that are itchy.  Discontinue clobetasol and only use 1-2 times daily for up to 2 weeks as needed for flares. Avoid applying to face, groin, and axilla. Use as directed. Long-term use can cause thinning of the skin.  Start hydrocortisone 2.5% daily to affected area at right cheek for a few days to calm down.  Once calmed down, start small amount of [place] to affected area a few times weekly. Start clindamycin 1-2 times daily to affected areas at face.   Continue metrocream twice daily.   Due to recent changes in healthcare laws, you may see results of your pathology and/or laboratory studies on MyChart before the doctors have had a chance to review them. We understand that in some cases there may be results that are confusing or concerning to you. Please understand that not all results are received at the same time and often the doctors may need to interpret multiple results in order to provide you with the best plan of care or course of treatment. Therefore, we ask that you please give [place] 2 business days to thoroughly review all your results before contacting the office for clarification. Should we see a critical lab result, you will be contacted sooner.   If You Need Anything After Your Visit  If you have any questions or concerns for your doctor, please call our main line at [phone] and press option 4 to reach your doctor's medical assistant. If no one answers, please leave a voicemail as directed and we will return your call as soon as possible. Messages left after 4 pm will be answered the following business day.   You may also send [place] a message via [place]. We typically respond to MyChart messages within 1-2 business  days.  For prescription refills, please ask your pharmacy to contact our office. Our fax number is [phone].  If you have an urgent issue when the clinic is closed that cannot wait until the next business day, you can page your doctor at the number below.    Please note that while we do our best to be available for urgent issues outside of office hours, we are not available 24/7.   If you have an urgent issue and are unable to reach [place], you may choose to seek medical care at your doctor's office, retail clinic, urgent care center, or emergency room.  If you have a medical emergency, please immediately call 911 or go to the emergency department.  Pager Numbers  - Dr. Nehemiah Massed: [phone]  - Dr. Laurence Ferrari: [phone]  - Dr. Nicole Kindred: [phone]  In the event of inclement weather, please call our main line at [phone] for an update on the status of any delays or closures.  Dermatology Medication Tips: Please keep the boxes that topical medications come in in order to help keep track of the instructions about where and how to use these. Pharmacies typically print the medication instructions only on the boxes and not directly on the medication tubes.   If your medication is too expensive, please contact our office at [phone] option 4 or send [place] a message through [place].   We are unable to tell what your co-pay for medications will be in advance as this is different depending on  your insurance coverage. However, we may be able to find a substitute medication at lower cost or fill out paperwork to get insurance to cover a needed medication.   If a prior authorization is required to get your medication covered by your insurance company, please allow [place] 1-2 business days to complete this process.  Drug prices often vary depending on where the prescription is filled and some pharmacies may offer cheaper prices.  The website www.goodrx.com contains coupons for medications through  different pharmacies. The prices here do not account for what the cost may be with help from insurance (it may be cheaper with your insurance), but the website can give you the price if you did not use any insurance.  - You can print the associated coupon and take it with your prescription to the pharmacy.  - You may also stop by our office during regular business hours and pick up a GoodRx coupon card.  - If you need your prescription sent electronically to a different pharmacy, notify our office through [facility] or by phone at [phone] option 4.     Si Usted Necesita Algo Despus de Su Visita  Tambin puede enviarnos un mensaje a travs de Pharmacist, community. Por lo general respondemos a los mensajes de MyChart en el transcurso de 1 a 2 das hbiles.  Para renovar recetas, por favor pida a su farmacia que se ponga en contacto con nuestra oficina. Harland Dingwall de fax es [place] [phone].  Si tiene un asunto urgente cuando la clnica est cerrada y que no puede esperar hasta el siguiente da hbil, puede llamar/localizar a su doctor(a) al nmero que aparece a continuacin.   Por favor, tenga en cuenta que aunque hacemos todo lo posible para estar disponibles para asuntos urgentes fuera del horario de [place], no estamos disponibles las 24 horas del da, los 7 das de la [place].   Si tiene un problema urgente y no puede comunicarse con nosotros, puede optar por buscar atencin mdica  en el consultorio de su doctor(a), en una clnica privada, en un centro de atencin urgente o en una sala de emergencias.  Si tiene Engineering geologist, por favor llame inmediatamente al 911 o vaya a la sala de emergencias.  Nmeros de bper  - Dr. Nehemiah Massed: [phone]  - Dra. Moye: [phone]  - Dra. Nicole Kindred: [phone]  En caso de inclemencias del [place], por favor llame a Johnsie Kindred principal al [phone] para una actualizacin sobre el [place] de cualquier retraso o cierre.  Consejos  para la medicacin en dermatologa: Por favor, guarde las cajas en las que vienen los medicamentos de uso tpico para ayudarle a seguir las instrucciones sobre dnde y cmo usarlos. Las farmacias generalmente imprimen las instrucciones del medicamento slo en las cajas y no directamente en los tubos del [place].   Si su medicamento es muy caro, por favor, pngase en contacto con Zigmund Daniel llamando al [phone] y presione la opcin 4 o envenos un mensaje a travs de Pharmacist, community.   No podemos decirle cul ser su copago por los medicamentos por adelantado ya que esto es diferente dependiendo de la cobertura de su seguro. Sin embargo, es posible que podamos encontrar un medicamento sustituto a Electrical engineer un formulario para que el seguro cubra el medicamento que se considera necesario.   Si se requiere una autorizacin previa para que su compaa de seguros [place] su medicamento, por favor permtanos de 1 a 2 das hbiles para completar este proceso.  Los precios de los medicamentos varan con frecuencia dependiendo del Environmental consultant de dnde se surte la receta y alguna farmacias pueden ofrecer precios ms baratos.  El sitio web www.goodrx.com tiene cupones para medicamentos de Airline pilot. Los precios aqu no tienen en cuenta lo que podra costar con la ayuda del seguro (puede ser ms barato con su seguro), pero el sitio web puede darle el precio si no utiliz Research scientist (physical sciences).  - Puede imprimir el cupn correspondiente y llevarlo con su receta a la farmacia.  - Tambin puede pasar por nuestra oficina durante el horario de atencin regular y Charity fundraiser una tarjeta de cupones de GoodRx.  - Si necesita que su receta se enve electrnicamente a una farmacia diferente, informe a nuestra oficina a travs de MyChart de [place] o por telfono llamando al [phone] y presione la opcin 4.

## 2022-12-02 ENCOUNTER — Encounter: Payer: Self-pay | Admitting: Dermatology

## 2023-02-27 ENCOUNTER — Other Ambulatory Visit: Payer: Self-pay | Admitting: Dermatology

## 2023-02-27 DIAGNOSIS — R21 Rash and other nonspecific skin eruption: Secondary | ICD-10-CM

## 2023-04-07 ENCOUNTER — Ambulatory Visit: Payer: Medicare Other | Admitting: Dermatology

## 2023-04-20 ENCOUNTER — Ambulatory Visit: Payer: Medicare Other | Admitting: Dermatology

## 2023-04-20 ENCOUNTER — Ambulatory Visit (INDEPENDENT_AMBULATORY_CARE_PROVIDER_SITE_OTHER): Payer: Medicare Other | Admitting: Dermatology

## 2023-04-20 ENCOUNTER — Encounter: Payer: Self-pay | Admitting: Dermatology

## 2023-04-20 DIAGNOSIS — L811 Chloasma: Secondary | ICD-10-CM | POA: Diagnosis not present

## 2023-04-20 DIAGNOSIS — L578 Other skin changes due to chronic exposure to nonionizing radiation: Secondary | ICD-10-CM | POA: Diagnosis not present

## 2023-04-20 DIAGNOSIS — L818 Other specified disorders of pigmentation: Secondary | ICD-10-CM | POA: Diagnosis not present

## 2023-04-20 DIAGNOSIS — L720 Epidermal cyst: Secondary | ICD-10-CM | POA: Diagnosis not present

## 2023-04-20 MED ORDER — METRONIDAZOLE 0.75 % EX CREA: TOPICAL_CREAM | Freq: Two times a day (BID) | CUTANEOUS | 2 refills | Status: DC

## 2023-04-20 NOTE — Progress Notes (Unsigned)
Follow-Up Visit   Subjective  Betty Reynolds is a 77 y.o. female who presents for the following: rosacea and acne follow up. Patient currently using Dove soap and witch hazel or alcohol to clean face, Aveeno to moisturize.    The following portions of the chart were reviewed this encounter and updated as appropriate: medications, allergies, medical history  Review of Systems:  No other skin or systemic complaints except as noted in HPI or Assessment and Plan.  Objective  Well appearing patient in no apparent distress; mood and affect are within normal limits.   A focused examination was performed of the following areas: face  Relevant exam findings are noted in the Assessment and Plan.  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 Condition is symptomatic/ bothersome to patient. Not currently at goal.   Rosacea is a chronic progressive skin condition usually affecting the face of adults, causing redness and/or acne bumps. It is treatable but not curable. It sometimes affects the eyes (ocular rosacea) as well. It may respond to topical and/or systemic medication and can flare with stress, sun exposure, alcohol, exercise, topical steroids (including hydrocortisone/cortisone 10) and some foods.  Daily application of broad spectrum spf 30+ sunscreen to face is recommended to reduce flares.  Patient denies grittiness of the eyes  Treatment Plan Start metronidazole 0.75% cream twice daily.  Recommend not using alcohol or witch hazel to clean face.   EPIDERMAL INCLUSION CYST Exam: Subcutaneous nodule at right preauricular  Benign-appearing. Exam most consistent with an epidermal inclusion cyst. Discussed that a cyst is a benign growth that can grow over time and sometimes get irritated or inflamed. Recommend  observation if it is not bothersome. Discussed option of surgical excision to remove it if it is growing, symptomatic, or other changes noted. Please call for new or changing lesions so they can be evaluated.  ACTINIC DAMAGE - chronic, secondary to cumulative UV radiation exposure/sun exposure over time - diffuse scaly erythematous macules with underlying dyspigmentation - Recommend daily broad spectrum sunscreen SPF 30+ to sun-exposed areas, reapply every 2 hours as needed.  - Recommend staying in the shade or wearing long sleeves, sun glasses (UVA+UVB protection) and wide brim hats (4-inch brim around the entire circumference of the hat). - Call for new or changing lesions.   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 Condition is symptomatic/ bothersome to patient. Not currently at goal.  Melasma is a chronic; persistent condition of hyperpigmented patches generally on the face, worse in summer due to higher UV exposure.    Heredity; thyroid disease; sun exposure; pregnancy; birth control pills; epilepsy medication and darker skin may predispose to Melasma.   Recommendations include: - Sun avoidance and daily broad spectrum (UVA/UVB) sunscreen SPF 30+, preferably with Zinc or Titanium Dioxide. - Rx topical bleaching creams (i.e. hydroquinone) is a common treatment but should not be used long term.  Hydroquinones may be mixed with retinoids; steroids; Kojic Acid. - Rx Azelaic Acid is also a treatment option that is safe for pregnancy (Category B). - OTC Heliocare can be helpful in control and prevention. - Oral Rx with Tranexamic Acid 250 mg - 650 mg po daily can be used for moderate to severe cases especially during summer (contraindications include pregnancy; lactation; hx  of PE; DVT; clotting disorder; heart disease; anticoagulant use and upcoming long trips)   - Chemical peels (would need multiple for  best result).  - Lasers and  Microdermabrasion may also be helpful adjunct treatments.  Start prescription [facility] Hydroquinone 8%, Tretinoin 0.1%, Kojic acid 1%, Niacinamide 4%, Fluocinolone 0.025% cream, a pea sized amount nightly to dark spots on face for up to 2 months. This cannot be used more than 3 months due to risk of skin atrophy (thinning) and exogenous ochronosis (permanent dark spots). The patient was advised this is not covered by insurance. They will receive an email to check out and the medication will be mailed to their home.    Return for 3-4 months for rosacea, melasma.  Anise Salvo, RMA, am acting as scribe for Darden Dates, MD .   Documentation: I have reviewed the above documentation for accuracy and completeness, and I agree with the above.  Darden Dates, MD

## 2023-04-20 NOTE — Patient Instructions (Addendum)
Start metronidazole 0.75% cream twice daily to face.  Recommend gentle cleansers and not using alcohol or witch hazel to clean face.   Start prescription [facility] Hydroquinone 8%, Tretinoin 0.1%, Kojic acid 1%, Niacinamide 4%, Fluocinolone 0.025% cream, a pea sized amount nightly to dark spots on face for up to 2 months. This cannot be used more than 3 months due to risk of skin atrophy (thinning) and exogenous ochronosis (permanent dark spots). The patient was advised this is not covered by insurance. They will receive an email to check out and the medication will be mailed to their home.  Instructions for [facility] Medications  One or more of your medications was sent to the [facility] mail order compounding pharmacy. You will receive an email from them and can purchase the medicine through that link. It will then be mailed to your home at the address you confirmed. If for any reason you do not receive an email from them, please check your spam folder. If you still do not find the email, please let us know. [facility] phone number is [phone].   Due to recent changes in healthcare laws, you may see results of your pathology and/or laboratory studies on MyChart before the doctors have had a chance to review them. We understand that in some cases there may be results that are confusing or concerning to you. Please understand that not all results are received at the same time and often the doctors may need to interpret multiple results in order to provide you with the best plan of care or course of treatment. Therefore, we ask that you please give [place] 2 business days to thoroughly review all your results before contacting the office for clarification. Should we see a critical lab result, you will be contacted sooner.   If You Need Anything After Your Visit  If you have any questions or concerns for your doctor, please call our main line at [phone] and press option 4 to  reach your doctor's medical assistant. If no one answers, please leave a voicemail as directed and we will return your call as soon as possible. Messages left after 4 pm will be answered the following business day.   You may also send [place] a message via MyChart. We typically respond to MyChart messages within 1-2 business days.  For prescription refills, please ask your pharmacy to contact our office. Our fax number is [phone].  If you have an urgent issue when the clinic is closed that cannot wait until the next business day, you can page your doctor at the number below.    Please note that while we do our best to be available for urgent issues outside of office hours, we are not available 24/7.   If you have an urgent issue and are unable to reach [place], you may choose to seek medical care at your doctor's office, retail clinic, urgent care center, or emergency room.  If you have a medical emergency, please immediately call 911 or go to the emergency department.  Pager Numbers  - Dr. Gwen Pounds: [phone]  - Dr. Neale Burly: [phone]  - Dr. Roseanne Reno: [phone]  In the event of inclement weather, please call our main line at [phone] for an update on the status of any delays or closures.  Dermatology Medication Tips: Please keep the boxes that topical medications come in in order to help keep track of the instructions about where and how to use these. Pharmacies typically print the medication instructions  only on the boxes and not directly on the medication tubes.   If your medication is too expensive, please contact our office at [phone] option 4 or send [place] a message through MyChart.   We are unable to tell what your co-pay for medications will be in advance as this is different depending on your insurance coverage. However, we may be able to find a substitute medication at lower cost or fill out paperwork to get insurance to cover a needed medication.   If a prior  authorization is required to get your medication covered by your insurance company, please allow [place] 1-2 business days to complete this process.  Drug prices often vary depending on where the prescription is filled and some pharmacies may offer cheaper prices.  The website www.goodrx.com contains coupons for medications through different pharmacies. The prices here do not account for what the cost may be with help from insurance (it may be cheaper with your insurance), but the website can give you the price if you did not use any insurance.  - You can print the associated coupon and take it with your prescription to the pharmacy.  - You may also stop by our office during regular business hours and pick up a GoodRx coupon card.  - If you need your prescription sent electronically to a different pharmacy, notify our office through [facility] or by phone at [phone] option 4.     Si Usted Necesita Algo Despus de Su Visita  Tambin puede enviarnos un mensaje a travs de Clinical cytogeneticist. Por lo general respondemos a los mensajes de MyChart en el transcurso de 1 a 2 das hbiles.  Para renovar recetas, por favor pida a su farmacia que se ponga en contacto con nuestra oficina. Annie Sable de fax es [place] [phone].  Si tiene un asunto urgente cuando la clnica est cerrada y que no puede esperar hasta el siguiente da hbil, puede llamar/localizar a su doctor(a) al nmero que aparece a continuacin.   Por favor, tenga en cuenta que aunque hacemos todo lo posible para estar disponibles para asuntos urgentes fuera del horario de [place], no estamos disponibles las 24 horas del da, los 7 [address]  [address] de la [place].   Si tiene un problema urgente y no puede comunicarse con nosotros, puede optar por buscar atencin mdica  en el consultorio de su doctor(a), en una clnica privada, en un centro de atencin urgente o en una sala de emergencias.  Si tiene Engineer, drilling, por favor llame  inmediatamente al 911 o vaya a la sala de emergencias.  Nmeros de bper  - Dr. Gwen Pounds: [phone]  - Dra. Moye: [phone]  - Dra. Roseanne Reno: [phone]  En caso de inclemencias del [place], por favor llame a Lacy Duverney principal al [phone] para una actualizacin sobre el [place] de cualquier retraso o cierre.  Consejos para la medicacin en dermatologa: Por favor, guarde las cajas en las que vienen los medicamentos de uso tpico para ayudarle a seguir las instrucciones sobre dnde y cmo usarlos. Las farmacias generalmente imprimen las instrucciones del medicamento slo en las cajas y no directamente en los tubos del [place].   Si su medicamento es muy caro, por favor, pngase en contacto con Rolm Gala llamando al [phone] y presione la opcin 4 o envenos un mensaje a travs de Clinical cytogeneticist.   No podemos decirle cul ser su copago por los medicamentos por adelantado ya que esto es diferente dependiendo de la cobertura de su seguro. Sin embargo, es  posible que podamos encontrar un medicamento sustituto a Audiological scientist un formulario para que el seguro cubra el medicamento que se considera necesario.   Si se requiere una autorizacin previa para que su compaa de seguros [place] su medicamento, por favor permtanos de 1 a 2 das hbiles para completar [address]  Los precios de los medicamentos varan con frecuencia dependiendo del Environmental consultant de dnde se surte la receta y alguna farmacias pueden ofrecer precios ms baratos.  El sitio web www.goodrx.com tiene cupones para medicamentos de Health and safety inspector. Los precios aqu no tienen en cuenta lo que podra costar con la ayuda del seguro (puede ser ms barato con su seguro), pero el sitio web puede darle el precio si no utiliz Tourist information centre manager.  - Puede imprimir el cupn correspondiente y llevarlo con su receta a la farmacia.  - Tambin puede pasar por nuestra oficina durante el horario de atencin regular y Education officer, museum  una tarjeta de cupones de GoodRx.  - Si necesita que su receta se enve electrnicamente a una farmacia diferente, informe a nuestra oficina a travs de MyChart de [facility] o por telfono llamando al [phone] y presione la opcin 4.

## 2023-06-22 ENCOUNTER — Ambulatory Visit (INDEPENDENT_AMBULATORY_CARE_PROVIDER_SITE_OTHER): Payer: Medicare Other | Admitting: Dermatology

## 2023-06-22 ENCOUNTER — Encounter: Payer: Self-pay | Admitting: Dermatology

## 2023-06-22 VITALS — BP 118/71

## 2023-06-22 DIAGNOSIS — L739 Follicular disorder, unspecified: Secondary | ICD-10-CM | POA: Diagnosis not present

## 2023-06-22 DIAGNOSIS — Z79899 Other long term (current) drug therapy: Secondary | ICD-10-CM

## 2023-06-22 MED ORDER — DOXYCYCLINE HYCLATE 100 MG PO TABS: 100.0000 mg | ORAL_TABLET | Freq: Two times a day (BID) | ORAL | 0 refills | Status: AC

## 2023-06-22 MED ORDER — MUPIROCIN 2 % EX OINT: 1.0000 | TOPICAL_OINTMENT | Freq: Two times a day (BID) | CUTANEOUS | 0 refills | Status: AC

## 2023-06-22 NOTE — Progress Notes (Signed)
   Follow-Up Visit   Subjective  Betty Reynolds is a 77 y.o. female who presents for the following: Swollen spot of right cheek x ~1 week  The following portions of the chart were reviewed this encounter and updated as appropriate: medications, allergies, medical history  Review of Systems:  No other skin or systemic complaints except as noted in HPI or Assessment and Plan.  Objective  Well appearing patient in no apparent distress; mood and affect are within normal limits. A focused examination was performed of the following areas: face Relevant exam findings are noted in the Assessment and Plan.   Assessment & Plan   Folliculitis Exam: tender swollen Red papule of right medial infraorbital, right ear  Treatment Plan: Doxycycline 100 mg 1 po bid with food and plenty of fluid, x 10 days Mupirocin oint bid   Return for Follow up as scheduled.  I, Joanie Coddington, CMA, am acting as scribe for Armida Sans, MD .  Documentation: I have reviewed the above documentation for accuracy and completeness, and I agree with the above.  Armida Sans, MD

## 2023-06-22 NOTE — Patient Instructions (Signed)
Due to recent changes in healthcare laws, you may see results of your pathology and/or laboratory studies on MyChart before the doctors have had a chance to review them. We understand that in some cases there may be results that are confusing or concerning to you. Please understand that not all results are received at the same time and often the doctors may need to interpret multiple results in order to provide you with the best plan of care or course of treatment. Therefore, we ask that you please give us 2 business days to thoroughly review all your results before contacting the office for clarification. Should we see a critical lab result, you will be contacted sooner.   If You Need Anything After Your Visit  If you have any questions or concerns for your doctor, please call our main line at [phone] and press option 4 to reach your doctor's medical assistant. If no one answers, please leave a voicemail as directed and we will return your call as soon as possible. Messages left after 4 pm will be answered the following business day.   You may also send us a message via MyChart. We typically respond to MyChart messages within 1-2 business days.  For prescription refills, please ask your pharmacy to contact our office. Our fax number is [phone].  If you have an urgent issue when the clinic is closed that cannot wait until the next business day, you can page your doctor at the number below.    Please note that while we do our best to be available for urgent issues outside of office hours, we are not available 24/7.   If you have an urgent issue and are unable to reach us, you may choose to seek medical care at your doctor's office, retail clinic, urgent care center, or emergency room.  If you have a medical emergency, please immediately call 911 or go to the emergency department.  Pager Numbers  - Dr. Kowalski: [phone]  - Dr. Moye: [phone]  - Dr. Stewart:  [phone]  In the event of inclement weather, please call our main line at [phone] for an update on the status of any delays or closures.  Dermatology Medication Tips: Please keep the boxes that topical medications come in in order to help keep track of the instructions about where and how to use these. Pharmacies typically print the medication instructions only on the boxes and not directly on the medication tubes.   If your medication is too expensive, please contact our office at [phone] option 4 or send us a message through MyChart.   We are unable to tell what your co-pay for medications will be in advance as this is different depending on your insurance coverage. However, we may be able to find a substitute medication at lower cost or fill out paperwork to get insurance to cover a needed medication.   If a prior authorization is required to get your medication covered by your insurance company, please allow us 1-2 business days to complete this process.  Drug prices often vary depending on where the prescription is filled and some pharmacies may offer cheaper prices.  The website www.goodrx.com contains coupons for medications through different pharmacies. The prices here do not account for what the cost may be with help from insurance (it may be cheaper with your insurance), but the website can give you the price if you did not use any insurance.  - You can print the associated coupon and take it with   your prescription to the pharmacy.  - You may also stop by our office during regular business hours and pick up a GoodRx coupon card.  - If you need your prescription sent electronically to a different pharmacy, notify our office through [place] MyChart or by phone at [phone] option 4.     Si Usted Necesita Algo Despus de Su Visita  Tambin puede enviarnos un mensaje a travs de MyChart. Por lo general respondemos a los mensajes de MyChart en el transcurso de 1 a 2  das hbiles.  Para renovar recetas, por favor pida a su farmacia que se ponga en contacto con nuestra oficina. Nuestro nmero de fax es el [phone].  Si tiene un asunto urgente cuando la clnica est cerrada y que no puede esperar hasta el siguiente da hbil, puede llamar/localizar a su doctor(a) al nmero que aparece a continuacin.   Por favor, tenga en cuenta que aunque hacemos todo lo posible para estar disponibles para asuntos urgentes fuera del horario de oficina, no estamos disponibles las 24 horas del da, los 7 das de la semana.   Si tiene un problema urgente y no puede comunicarse con nosotros, puede optar por buscar atencin mdica  en el consultorio de su doctor(a), en una clnica privada, en un centro de atencin urgente o en una sala de emergencias.  Si tiene una emergencia mdica, por favor llame inmediatamente al 911 o vaya a la sala de emergencias.  Nmeros de bper  - Dr. Kowalski: [phone]  - Dra. Moye: [phone]  - Dra. Stewart: [phone]  En caso de inclemencias del tiempo, por favor llame a nuestra lnea principal al [phone] para una actualizacin sobre el estado de cualquier retraso o cierre.  Consejos para la medicacin en dermatologa: Por favor, guarde las cajas en las que vienen los medicamentos de uso tpico para ayudarle a seguir las instrucciones sobre dnde y cmo usarlos. Las farmacias generalmente imprimen las instrucciones del medicamento slo en las cajas y no directamente en los tubos del medicamento.   Si su medicamento es muy caro, por favor, pngase en contacto con nuestra oficina llamando al [phone] y presione la opcin 4 o envenos un mensaje a travs de MyChart.   No podemos decirle cul ser su copago por los medicamentos por adelantado ya que esto es diferente dependiendo de la cobertura de su seguro. Sin embargo, es posible que podamos encontrar un medicamento sustituto a menor costo o llenar un formulario para que el  seguro cubra el medicamento que se considera necesario.   Si se requiere una autorizacin previa para que su compaa de seguros cubra su medicamento, por favor permtanos de 1 a 2 das hbiles para completar este proceso.  Los precios de los medicamentos varan con frecuencia dependiendo del lugar de dnde se surte la receta y alguna farmacias pueden ofrecer precios ms baratos.  El sitio web www.goodrx.com tiene cupones para medicamentos de diferentes farmacias. Los precios aqu no tienen en cuenta lo que podra costar con la ayuda del seguro (puede ser ms barato con su seguro), pero el sitio web puede darle el precio si no utiliz ningn seguro.  - Puede imprimir el cupn correspondiente y llevarlo con su receta a la farmacia.  - Tambin puede pasar por nuestra oficina durante el horario de atencin regular y recoger una tarjeta de cupones de GoodRx.  - Si necesita que su receta se enve electrnicamente a una farmacia diferente, informe a nuestra oficina a travs de MyChart de [place]   o por telfono llamando al [phone] y presione la opcin 4.  

## 2023-06-29 ENCOUNTER — Telehealth: Payer: Self-pay

## 2023-06-29 ENCOUNTER — Other Ambulatory Visit: Payer: Self-pay

## 2023-06-29 MED ORDER — CEFDINIR 300 MG PO CAPS: ORAL_CAPSULE | ORAL | 0 refills | Status: AC

## 2023-06-29 NOTE — Telephone Encounter (Signed)
Patient called to let Dr Kirtland Bouchard know she took Doxycycline for 1 day last Thursday and she begin itching, so she stopped Doxycycline, she would like a different antibiotic.

## 2023-06-29 NOTE — Telephone Encounter (Signed)
Called pt discussed we will call in Omnicef 300 mg PO bid x 7 days #14  0RF

## 2023-06-29 NOTE — Telephone Encounter (Signed)
[facility] pharmacy and called patient to give correct rx should be Omnicef 300 mg PO bid x 10 days #20 0RF

## 2023-08-19 ENCOUNTER — Ambulatory Visit: Payer: Medicare Other | Admitting: Dermatology

## 2023-09-07 ENCOUNTER — Ambulatory Visit: Payer: Medicare Other | Admitting: Dermatology

## 2024-03-07 ENCOUNTER — Ambulatory Visit: Admitting: Dermatology

## 2024-03-08 ENCOUNTER — Ambulatory Visit (INDEPENDENT_AMBULATORY_CARE_PROVIDER_SITE_OTHER)

## 2024-03-08 ENCOUNTER — Ambulatory Visit (INDEPENDENT_AMBULATORY_CARE_PROVIDER_SITE_OTHER): Admitting: Podiatry

## 2024-03-08 ENCOUNTER — Encounter: Payer: Self-pay | Admitting: Podiatry

## 2024-03-08 DIAGNOSIS — M2042 Other hammer toe(s) (acquired), left foot: Secondary | ICD-10-CM | POA: Diagnosis not present

## 2024-03-08 DIAGNOSIS — S90122A Contusion of left lesser toe(s) without damage to nail, initial encounter: Secondary | ICD-10-CM

## 2024-03-08 NOTE — Progress Notes (Signed)
  Subjective:  Patient ID: Betty Reynolds, female    DOB: Sep 16, 1946,  MRN: [id number]  Chief Complaint  Patient presents with   Toe Injury    "I think I broke my second toe on my left foot."    78 y.o. female presents with the above complaint. History confirmed with patient.  She thinks about 3 weeks ago she started a hospital bed while visiting family  Objective:  Physical Exam: warm, good capillary refill, no trophic changes or ulcerative lesions, normal DP and PT pulses, normal sensory exam, and left second toe pain over the proximal phalanx, medially at the middle phalanx there is a medial corn.    Radiographs: Multiple views x-ray of the left foot: Possibly nondisplaced fracture at proximal phalanx distally, arthritic changes of the lesser toes Assessment:   1. Contusion of lesser toe of left foot without damage to nail, initial encounter      Plan:  Patient was evaluated and treated and all questions answered.   I reviewed her x-rays with her and discussed the presence of the arthritis and likely possibly nondisplaced fracture and that she has a likely bony contusion if not fracture.  I displayed buddy taping to her and recommended offloading corn with a silicone pad this was dispensed.  Follow-up as needed for this.  Return if symptoms worsen or fail to improve.

## 2024-03-16 ENCOUNTER — Ambulatory Visit: Admitting: Dermatology

## 2024-03-16 ENCOUNTER — Encounter: Payer: Self-pay | Admitting: Dermatology

## 2024-03-16 DIAGNOSIS — L719 Rosacea, unspecified: Secondary | ICD-10-CM | POA: Diagnosis not present

## 2024-03-16 DIAGNOSIS — L821 Other seborrheic keratosis: Secondary | ICD-10-CM

## 2024-03-16 DIAGNOSIS — L739 Follicular disorder, unspecified: Secondary | ICD-10-CM

## 2024-03-16 MED ORDER — METRONIDAZOLE 0.75 % EX CREA: TOPICAL_CREAM | Freq: Two times a day (BID) | CUTANEOUS | 2 refills | Status: AC

## 2024-03-16 NOTE — Progress Notes (Signed)
   Follow-Up Visit   Subjective  Betty Reynolds is a 78 y.o. female who presents for the following: place at bilateral cheeks and posterior neck x couple weeks, get itchy, dry.   The patient has spots, moles and lesions to be evaluated, some may be new or changing and the patient may have concern these could be cancer.   The following portions of the chart were reviewed this encounter and updated as appropriate: medications, allergies, medical history  Review of Systems:  No other skin or systemic complaints except as noted in HPI or Assessment and Plan.  Objective  Well appearing patient in no apparent distress; mood and affect are within normal limits.  A focused examination was performed of the following areas: Face, neck   Relevant exam findings are noted in the Assessment and Plan.    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 Condition is bothersome/symptomatic for patient. Currently flared.   Rosacea is a chronic progressive skin condition usually affecting the face of adults, causing redness and/or acne bumps. It is treatable but not curable. It sometimes affects the eyes (ocular rosacea) as well. It may respond to topical and/or systemic medication and can flare with stress, sun exposure, alcohol, exercise, topical steroids (including hydrocortisone/cortisone 10) and some foods.  Daily application of broad spectrum spf 30+ sunscreen to face is recommended to reduce flares.   Treatment Plan Use metronidazole (METROCREAM) 0.75% cream 1-2 times daily, can take about 2 months to see full benefits. Apply to areas where bumps appear at cheeks and nose.    Inflamed Hair Follicles and SK At posterior neck Exam: single inflamed follicular papule on posterior neck. Pigmented keratotic papules on  neck  Treatment plan: Recommend OTC Hibiclens Wash daily in shower, use at neck.      ROSACEA   FOLLICULITIS   DERMATOSIS PAPULOSA NIGRA    Return in about 3 months (around 06/16/2024) for w/ Dr. Katrinka Blazing for rosacea.  Wynonia Lawman, CMA, am acting as scribe for Elie Goody, MD .   Documentation: I have reviewed the above documentation for accuracy and completeness, and I agree with the above.  Elie Goody, MD

## 2024-03-16 NOTE — Patient Instructions (Addendum)

## 2024-03-23 ENCOUNTER — Ambulatory Visit (INDEPENDENT_AMBULATORY_CARE_PROVIDER_SITE_OTHER): Admitting: Podiatry

## 2024-03-23 DIAGNOSIS — R21 Rash and other nonspecific skin eruption: Secondary | ICD-10-CM | POA: Insufficient documentation

## 2024-03-23 DIAGNOSIS — Q828 Other specified congenital malformations of skin: Secondary | ICD-10-CM | POA: Diagnosis not present

## 2024-03-23 NOTE — Progress Notes (Signed)
  Subjective:  Patient ID: Betty Reynolds, female    DOB: July 03, 1946,  MRN: [id number]  Chief Complaint  Patient presents with   Toe Pain    Left foot pain in between the toe causing some pain pt stated that she thought it was a corn     78 y.o. female presents with the above complaint. History confirmed with patient.  She did not has any other acute, she states has been dealing with this for quite some time.  She is known to Dr. Lilian Kapur.    Objective:  Physical Exam: warm, good capillary refill, no trophic changes or ulcerative lesions, normal DP and PT pulses, normal sensory exam, and multiple porokeratosis left submet 5 being the most painful. Assessment:   No diagnosis found.     Plan:  Patient was evaluated and treated and all questions answered.  Discussed etiology and treatment options of porokeratosis in detail with her.  The lesion was debrided down as a courtesy today.  I discussed offloading and shoe gear modification as well.  Patient states understanding.  No follow-ups on file.

## 2024-06-19 ENCOUNTER — Ambulatory Visit: Admitting: Dermatology

## 2024-06-26 ENCOUNTER — Encounter: Payer: Self-pay | Admitting: Dermatology

## 2024-06-26 ENCOUNTER — Ambulatory Visit (INDEPENDENT_AMBULATORY_CARE_PROVIDER_SITE_OTHER): Admitting: Dermatology

## 2024-06-26 DIAGNOSIS — L719 Rosacea, unspecified: Secondary | ICD-10-CM

## 2024-06-26 DIAGNOSIS — Z7189 Other specified counseling: Secondary | ICD-10-CM | POA: Diagnosis not present

## 2024-06-26 DIAGNOSIS — Z79899 Other long term (current) drug therapy: Secondary | ICD-10-CM

## 2024-06-26 MED ORDER — MINOCYCLINE HCL 100 MG PO CAPS: 100.0000 mg | ORAL_CAPSULE | Freq: Two times a day (BID) | ORAL | 1 refills | Status: DC

## 2024-06-26 MED ORDER — MINOCYCLINE HCL 50 MG PO CAPS: 50.0000 mg | ORAL_CAPSULE | Freq: Two times a day (BID) | ORAL | 1 refills | Status: AC

## 2024-06-26 MED ORDER — AMBULATORY NON FORMULARY MEDICATION: 2 refills | Status: AC

## 2024-06-26 NOTE — Patient Instructions (Addendum)
 [facility] [address] [place], [place] [address]  Phone: [phone] TOLL-FREE: [phone]  Start minocycline 50 mg twice daily with food.   Due to recent changes in healthcare laws, you may see results of your pathology and/or laboratory studies on MyChart before the doctors have had a chance to review them. We understand that in some cases there may be results that are confusing or concerning to you. Please understand that not all results are received at the same time and often the doctors may need to interpret multiple results in order to provide you with the best plan of care or course of treatment. Therefore, we ask that you please give us  2 business days to thoroughly review all your results before contacting the office for clarification. Should we see a critical lab result, you will be contacted sooner.   If You Need Anything After Your Visit  If you have any questions or concerns for your doctor, please call our main line at [phone] and press option 4 to reach your doctor's medical assistant. If no one answers, please leave a voicemail as directed and we will return your call as soon as possible. Messages left after 4 pm will be answered the following business day.   You may also send us  a message via MyChart. We typically respond to MyChart messages within 1-2 business days.  For prescription refills, please ask your pharmacy to contact our office. Our fax number is [phone].  If you have an urgent issue when the clinic is closed that cannot wait until the next business day, you can page your doctor at the number below.    Please note that while we do our best to be available for urgent issues outside of office hours, we are not available 24/7.   If you have an urgent issue and are unable to reach us , you may choose to seek medical care at your doctor's office, retail clinic, urgent care center, or emergency room.  If you have a medical emergency, please  immediately call 911 or go to the emergency department.  Pager Numbers  - Dr. Hester: [phone]  - Dr. Jackquline: [phone]  - Dr. Claudene: [phone]   In the event of inclement weather, please call our main line at [phone] for an update on the status of any delays or closures.  Dermatology Medication Tips: Please keep the boxes that topical medications come in in order to help keep track of the instructions about where and how to use these. Pharmacies typically print the medication instructions only on the boxes and not directly on the medication tubes.   If your medication is too expensive, please contact our office at [phone] option 4 or send us  a message through MyChart.   We are unable to tell what your co-pay for medications will be in advance as this is different depending on your insurance coverage. However, we may be able to find a substitute medication at lower cost or fill out paperwork to get insurance to cover a needed medication.   If a prior authorization is required to get your medication covered by your insurance company, please allow us  1-2 business days to complete this process.  Drug prices often vary depending on where the prescription is filled and some pharmacies may offer cheaper prices.  The website www.goodrx.com contains coupons for medications through different pharmacies. The prices here do not account for what the cost may be with help from insurance (it may be cheaper with your insurance), but the  website can give you the price if you did not use any insurance.  - You can print the associated coupon and take it with your prescription to the pharmacy.  - You may also stop by our office during regular business hours and pick up a GoodRx coupon card.  - If you need your prescription sent electronically to a different pharmacy, notify our office through [facility] or by phone at [phone] option 4.     Si Usted Necesita Algo  Despus de Su Visita  Tambin puede enviarnos un mensaje a travs de Clinical cytogeneticist. Por lo general respondemos a los mensajes de MyChart en el transcurso de 1 a 2 das hbiles.  Para renovar recetas, por favor pida a su farmacia que se ponga en contacto con nuestra oficina. Randi lakes de fax es [place] [phone].  Si tiene un asunto urgente cuando la clnica est cerrada y que no puede esperar hasta el siguiente da hbil, puede llamar/localizar a su doctor(a) al nmero que aparece a continuacin.   Por favor, tenga en cuenta que aunque hacemos todo lo posible para estar disponibles para asuntos urgentes fuera del horario de [place], no estamos disponibles las 24 horas del da, los 7 [address]  [address] de la [place].   Si tiene un problema urgente y no puede comunicarse con nosotros, puede optar por buscar atencin mdica  en el consultorio de su doctor(a), en una clnica privada, en un centro de atencin urgente o en una sala de emergencias.  Si tiene Engineer, drilling, por favor llame inmediatamente al 911 o vaya a la sala de emergencias.  Nmeros de bper  - Dr. Hester: [phone]  - Dra. Jackquline: [phone]  - Dr. Claudene: [phone]   En caso de inclemencias del tiempo, por favor llame a landry capes principal al [phone] para una actualizacin sobre el [place] de cualquier retraso o cierre.  Consejos para la medicacin en dermatologa: Por favor, guarde las cajas en las que vienen los medicamentos de uso tpico para ayudarle a seguir las instrucciones sobre dnde y cmo usarlos. Las farmacias generalmente imprimen las instrucciones del medicamento slo en las cajas y no directamente en los tubos del [place].   Si su medicamento es muy caro, por favor, pngase en contacto con landry rieger llamando al [phone] y presione la opcin 4 o envenos un mensaje a travs de Clinical cytogeneticist.   No podemos decirle cul ser su copago por los medicamentos por adelantado ya que esto es diferente  dependiendo de la cobertura de su seguro. Sin embargo, es posible que podamos encontrar un medicamento sustituto a Audiological scientist un formulario para que el seguro cubra el medicamento que se considera necesario.   Si se requiere una autorizacin previa para que su compaa de seguros malta su medicamento, por favor permtanos de 1 a 2 das hbiles para completar este proceso.  Los precios de los medicamentos varan con frecuencia dependiendo del Environmental consultant de dnde se surte la receta y alguna farmacias pueden ofrecer precios ms baratos.  El sitio web www.goodrx.com tiene cupones para medicamentos de Health and safety inspector. Los precios aqu no tienen en cuenta lo que podra costar con la ayuda del seguro (puede ser ms barato con su seguro), pero el sitio web puede darle el precio si no utiliz Tourist information centre manager.  - Puede imprimir el cupn correspondiente y llevarlo con su receta a la farmacia.  - Tambin puede pasar por nuestra oficina durante el horario de atencin regular y Education officer, museum una tarjeta de cupones de  GoodRx.  - Si necesita que su receta se enve electrnicamente a una farmacia diferente, informe a nuestra oficina a travs de MyChart de [place] o por telfono llamando al [phone] y presione la opcin 4.

## 2024-06-26 NOTE — Progress Notes (Signed)
   Follow-Up Visit   Subjective  Betty Reynolds is a 78 y.o. female who presents for the following: 3 month rosacea follow up. Currently using metronidazole  cream once daily. Patient advises she is continuing to get bumps and has not noticed any difference.   The patient has spots, moles and lesions to be evaluated, some may be new or changing and the patient may have concern these could be cancer.   The following portions of the chart were reviewed this encounter and updated as appropriate: medications, allergies, medical history  Review of Systems:  No other skin or systemic complaints except as noted in HPI or Assessment and Plan.  Objective  Well appearing patient in no apparent distress; mood and affect are within normal limits.    A focused examination was performed of the following areas: face  Relevant exam findings are noted in the Assessment and Plan.    Assessment & Plan   ROSACEA Exam Few scattered inflammatory papules on cheeks and nose and posterior neck. Zygoma telangiectasias b/l  Chronic and persistent condition with duration or expected duration over one year. Condition is symptomatic/ bothersome to patient. Not currently at goal.  Rosacea is a chronic progressive skin condition usually affecting the face of adults, causing redness and/or acne bumps. It is treatable but not curable. It sometimes affects the eyes (ocular rosacea) as well. It may respond to topical and/or systemic medication and can flare with stress, sun exposure, alcohol, exercise, topical steroids (including hydrocortisone /cortisone 10) and some foods.  Daily application of broad spectrum spf 30+ sunscreen to face is recommended to reduce flares.   Treatment Plan Start minocycline 50 mg twice daily with food. Patient unable to tolerate doxycycline .   Reviewed side effects of minocycline with patient. GI upset, photosensitivity, avoid calcium when taking, rash, lupus-like reaction,  pigmentation. Stop if side effects occur  Start triple cream once daily from [employer]	[place]. Patient advised not covered by insurance.  ROSACEA   COUNSELING AND COORDINATION OF CARE   MEDICATION MANAGEMENT    Return in about 2 months (around 08/26/2024) for Rosacea.  LILLETTE Lonell Drones, RMA, am acting as scribe for Boneta Sharps, MD .   Documentation: I have reviewed the above documentation for accuracy and completeness, and I agree with the above.  Boneta Sharps, MD

## 2024-08-09 ENCOUNTER — Ambulatory Visit: Admitting: Dermatology

## 2024-08-23 ENCOUNTER — Ambulatory Visit (INDEPENDENT_AMBULATORY_CARE_PROVIDER_SITE_OTHER): Admitting: Podiatry

## 2024-08-23 VITALS — Ht 69.0 in | Wt 189.0 lb

## 2024-08-23 DIAGNOSIS — M2012 Hallux valgus (acquired), left foot: Secondary | ICD-10-CM

## 2024-08-23 DIAGNOSIS — M2042 Other hammer toe(s) (acquired), left foot: Secondary | ICD-10-CM

## 2024-08-24 NOTE — Progress Notes (Signed)
  Subjective:  Patient ID: Betty Reynolds, female    DOB: Oct 21, 1946,  MRN: [id number]  Chief Complaint  Patient presents with   Callouses    Rm 4 Patient is here for callus/growth on the left index toe.    78 y.o. female presents with the above complaint. History confirmed with patient. She returns for follow-up still having significant pain between the 2nd and 1st toe of the left foot.  She soaked her foot recently and got a large amount of the skin off.  So far overall failure changes and padding have been relatively unsuccessful for her  Objective:  Physical Exam: warm, good capillary refill, no trophic changes or ulcerative lesions, normal DP and PT pulses, normal sensory exam, and left foot with hallux interphalangeus deformity and the hallux abuts the second toe, on the plantar lateral PIPJ of the second toe there is a large prominence of bone.   Radiographs: Multiple views x-ray of the left foot: Previous x-rays taken on 03/08/2024 show significant hallux interphalangeus deformity arthritic changes of second PIPJ Assessment:   1. Acquired hallux interphalangeus, left   2. Hammertoe of left foot      Plan:  Patient was evaluated and treated and all questions answered.  So for the minimal improvement with shoe gear padding and local skin care.  Most of the bony prominence is still present.  We discussed the option of surgical correction.  Surgically we discussed correction of the deformity with arthroplasty of his second toe and Akin osteotomy with phalangeal bunionectomy of the great toe.  We discussed the risk benefits and potential complications including but not limited to  pain, swelling, infection, scar, numbness which may be temporary or permanent, chronic pain, stiffness, nerve pain or damage, wound healing problems, bone healing problems including delayed or non-union.  She understands and wishes to proceed.  All questions addressed.  Informed consent signed and reviewed.   Postoperative course also reviewed including the.  Weightbearing in a cam walker boot for approximately 6 weeks   Surgical plan:  Procedure: - Left foot bunionectomy with phalangeal osteotomy, second hammertoe arthroplasty  Location: - [facility]  Anesthesia plan: - Sedation with Exparel local  Postoperative pain plan: - Tylenol 1000 mg every 6 hours, ibuprofen 600 mg every 6 hours, gabapentin 300 mg every 8 hours x5 days, oxycodone 5 mg 1-2 tabs every 6 hours only as needed  DVT prophylaxis: - None required  WB Restrictions / DME needs: - WBAT in CAM boot postop  No follow-ups on file.

## 2024-08-29 ENCOUNTER — Ambulatory Visit

## 2024-08-31 ENCOUNTER — Ambulatory Visit: Admitting: Dermatology

## 2024-09-08 ENCOUNTER — Telehealth: Payer: Self-pay | Admitting: Podiatry

## 2024-09-08 NOTE — Telephone Encounter (Signed)
 Received surgical consent forms   Left message for pt to call to get surgery scheduled

## 2024-10-25 ENCOUNTER — Ambulatory Visit (INDEPENDENT_AMBULATORY_CARE_PROVIDER_SITE_OTHER)

## 2024-10-25 ENCOUNTER — Ambulatory Visit

## 2024-10-25 ENCOUNTER — Other Ambulatory Visit: Payer: Self-pay

## 2024-10-25 DIAGNOSIS — L219 Seborrheic dermatitis, unspecified: Secondary | ICD-10-CM

## 2024-10-25 MED ORDER — KETOCONAZOLE 2 % EX CREA: TOPICAL_CREAM | CUTANEOUS | 5 refills | Status: DC

## 2024-10-25 MED ORDER — CLOBETASOL PROPIONATE 0.05 % EX SOLN: CUTANEOUS | 5 refills | Status: AC

## 2024-10-25 MED ORDER — KETOCONAZOLE 2 % EX SHAM: MEDICATED_SHAMPOO | CUTANEOUS | 5 refills | Status: AC

## 2024-10-25 MED ORDER — TACROLIMUS 0.1 % EX OINT: TOPICAL_OINTMENT | CUTANEOUS | 5 refills | Status: AC

## 2024-10-25 NOTE — Patient Instructions (Addendum)
 Ketoconazole  shampoo and Clobetasol  solution are for the scalp.  Ketoconazole  cream and Tacrolimus ointment are for the face.   Due to recent changes in healthcare laws, you may see results of your pathology and/or laboratory studies on MyChart before the doctors have had a chance to review them. We understand that in some cases there may be results that are confusing or concerning to you. Please understand that not all results are received at the same time and often the doctors may need to interpret multiple results in order to provide you with the best plan of care or course of treatment. Therefore, we ask that you please give us  2 business days to thoroughly review all your results before contacting the office for clarification. Should we see a critical lab result, you will be contacted sooner.   If You Need Anything After Your Visit  If you have any questions or concerns for your doctor, please call our main line at [phone] and press option 4 to reach your doctor's medical assistant. If no one answers, please leave a voicemail as directed and we will return your call as soon as possible. Messages left after 4 pm will be answered the following business day.   You may also send us  a message via MyChart. We typically respond to MyChart messages within 1-2 business days.  For prescription refills, please ask your pharmacy to contact our office. Our fax number is [phone].  If you have an urgent issue when the clinic is closed that cannot wait until the next business day, you can page your doctor at the number below.    Please note that while we do our best to be available for urgent issues outside of office hours, we are not available 24/7.   If you have an urgent issue and are unable to reach us , you may choose to seek medical care at your doctor's office, retail clinic, urgent care center, or emergency room.  If you have a medical emergency, please immediately call 911 or go to the  emergency department.  Pager Numbers  - Dr. Hester: [phone]  - Dr. Jackquline: [phone]  - Dr. Claudene: [phone]   - Dr. Raymund: [phone]  In the event of inclement weather, please call our main line at [phone] for an update on the status of any delays or closures.  Dermatology Medication Tips: Please keep the boxes that topical medications come in in order to help keep track of the instructions about where and how to use these. Pharmacies typically print the medication instructions only on the boxes and not directly on the medication tubes.   If your medication is too expensive, please contact our office at [phone] option 4 or send us  a message through MyChart.   We are unable to tell what your co-pay for medications will be in advance as this is different depending on your insurance coverage. However, we may be able to find a substitute medication at lower cost or fill out paperwork to get insurance to cover a needed medication.   If a prior authorization is required to get your medication covered by your insurance company, please allow us  1-2 business days to complete this process.  Drug prices often vary depending on where the prescription is filled and some pharmacies may offer cheaper prices.  The website www.goodrx.com contains coupons for medications through different pharmacies. The prices here do not account for what the cost may be with help from insurance (it may be cheaper with your insurance),  but the website can give you the price if you did not use any insurance.  - You can print the associated coupon and take it with your prescription to the pharmacy.  - You may also stop by our office during regular business hours and pick up a GoodRx coupon card.  - If you need your prescription sent electronically to a different pharmacy, notify our office through [facility] or by phone at [phone] option 4.     Si Usted Necesita Algo Despus de  Su Visita  Tambin puede enviarnos un mensaje a travs de Clinical Cytogeneticist. Por lo general respondemos a los mensajes de MyChart en el transcurso de 1 a 2 das hbiles.  Para renovar recetas, por favor pida a su farmacia que se ponga en contacto con nuestra oficina. Randi lakes de fax es [place] [phone].  Si tiene un asunto urgente cuando la clnica est cerrada y que no puede esperar hasta el siguiente da hbil, puede llamar/localizar a su doctor(a) al nmero que aparece a continuacin.   Por favor, tenga en cuenta que aunque hacemos todo lo posible para estar disponibles para asuntos urgentes fuera del horario de [place], no estamos disponibles las 24 horas del da, los 7 [address]  [address] de la [place].   Si tiene un problema urgente y no puede comunicarse con nosotros, puede optar por buscar atencin mdica  en el consultorio de su doctor(a), en una clnica privada, en un centro de atencin urgente o en una sala de emergencias.  Si tiene engineer, drilling, por favor llame inmediatamente al 911 o vaya a la sala de emergencias.  Nmeros de bper  - Dr. Hester: [phone]  - Dra. Jackquline: [phone]  - Dr. Claudene: [phone]  - Dra. Kitts: [phone]  En caso de inclemencias del [place], por favor llame a nuestra lnea principal al [phone] para una actualizacin sobre el estado de cualquier retraso o cierre.  Consejos para la medicacin en dermatologa: Por favor, guarde las cajas en las que vienen los medicamentos de uso tpico para ayudarle a seguir las instrucciones sobre dnde y cmo usarlos. Las farmacias generalmente imprimen las instrucciones del medicamento slo en las cajas y no directamente en los tubos del [place].   Si su medicamento es muy caro, por favor, pngase en contacto con landry rieger llamando al [phone] y presione la opcin 4 o envenos un mensaje a travs de Clinical Cytogeneticist.   No podemos decirle cul ser su copago por los medicamentos por adelantado ya que  esto es diferente dependiendo de la cobertura de su seguro. Sin embargo, es posible que podamos encontrar un medicamento sustituto a audiological scientist un formulario para que el seguro cubra el medicamento que se considera necesario.   Si se requiere una autorizacin previa para que su compaa de seguros cubra su medicamento, por favor permtanos de 1 a 2 das hbiles para completar este proceso.  Los precios de los medicamentos varan con frecuencia dependiendo del environmental consultant de dnde se surte la receta y alguna farmacias pueden ofrecer precios ms baratos.  El sitio web www.goodrx.com tiene cupones para medicamentos de health and safety inspector. Los precios aqu no tienen en cuenta lo que podra costar con la ayuda del seguro (puede ser ms barato con su seguro), pero el sitio web puede darle el precio si no utiliz tourist information centre manager.  - Puede imprimir el cupn correspondiente y llevarlo con su receta a la farmacia.  - Tambin puede pasar por nuestra oficina durante el horario de atencin regular y  recoger una tarjeta de cupones de GoodRx.  - Si necesita que su receta se enve electrnicamente a una farmacia diferente, informe a nuestra oficina a travs de MyChart de [place] o por telfono llamando al [phone] y presione la opcin 4.

## 2024-10-25 NOTE — Progress Notes (Signed)
    Subjective   Betty Reynolds is a 78 y.o. female who presents for the following: Rash. Patient is established patient   Today patient reports: Rash on scalp and neck since Monday; itchy not painful. Patient has tried neosporin w/o relief.   Review of Systems:    No other skin or systemic complaints except as noted in HPI or Assessment and Plan.  The following portions of the chart were reviewed this encounter and updated as appropriate: medications, allergies, medical history  Relevant Medical History:  n/a   Objective  Well appearing patient in no apparent distress; mood and affect are within normal limits. Examination was performed of the: Focused Exam of: Scalp, face, neck   Examination notable for: Seborrheic Dermatitis: Erythema and scaling of the scalp, ear canals, and central face > rest of face.  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 Condition is symptomatic and bothersome to patient. Patient is flaring and not currently at treatment goal.  - Discussed diagnosis, typical course, and treatment options for this condition - Explained to the patient the chronic nature of this diagnosis - Start ketoconazole  2% shampoo TIW, apply to scalp and affected areas of face/body and allow the shampoo to sit for 5-10 minutes before rinsing off - Start clobetasol  solution 0.05% twice daily to affected skin of the scalp Discussed side effect of super potent topical steroids including atrophy, dyspigmentation, striae, telangectasia, folliculitis, loss of skin pigment, hair growth, tachyphylaxis, risk of systemic absorption with missuse. Start tacrolimus ointment 0.1% twice daily on the affected areas of the face Educated about black box warning (and also that recent studies show no increased malignancy risk) and  common adverse effects such as burning with application (advised to cool in fridge and only apply to bone-dry skin). Given location of eruption, unsafe to use daily topical CS to area. Patient requires topical calcineurin inhibitor given high risk of dyspigmentation and atrophy from topical CS use in sensitive area. - Start ketoconazole  Cream 2% apply topically to affected areas of the face    Level of service outlined above   Procedures, orders, diagnosis for this visit:  SEBORRHEIC DERMATITIS   Related Medications ketoconazole  (NIZORAL ) 2 % shampoo Apply 1 Application topically 2 (two) times a week. Wash scalp 2 times weekly, let sit 5 minutes and rinse out  Seborrheic dermatitis  Other orders -     Ketoconazole ; Apply to scalp as a shampoo 2-3 times weekly. Let sit for 5 minutes before rinsing.  Dispense: 120 mL; Refill: 5 -     Clobetasol  Propionate; Apply 1 mL to affected areas of skin twice daily  Dispense: 50 mL; Refill: 5 -     Ketoconazole ; Apply to affected area of skin twice daily  Dispense: 30 g; Refill: 5 -     Tacrolimus; Apply 2 grams twice daily to affected areas of skin  Dispense: 100 g; Refill: 5    Return to clinic: Return 6-8 weeks, for [facility] derm.  I, Emerick Ege, CMA am acting as scribe for Lauraine JAYSON Kanaris, MD.  Documentation: I have reviewed the above documentation for accuracy and completeness, and I agree with the above.  Lauraine JAYSON Kanaris, MD
